# Patient Record
Sex: Female | Born: 1935 | Race: White | Hispanic: No | Marital: Married | Smoking: Former smoker
Health system: Southern US, Community
[De-identification: ages and names within clinical notes are randomized; demographics above are authoritative.]

## PROBLEM LIST (undated history)

## (undated) DIAGNOSIS — S82202A Unspecified fracture of shaft of left tibia, initial encounter for closed fracture: Secondary | ICD-10-CM

## (undated) DIAGNOSIS — E119 Type 2 diabetes mellitus without complications: Secondary | ICD-10-CM

## (undated) DIAGNOSIS — S62101A Fracture of unspecified carpal bone, right wrist, initial encounter for closed fracture: Secondary | ICD-10-CM

## (undated) DIAGNOSIS — I2510 Atherosclerotic heart disease of native coronary artery without angina pectoris: Secondary | ICD-10-CM

## (undated) DIAGNOSIS — J939 Pneumothorax, unspecified: Secondary | ICD-10-CM

## (undated) DIAGNOSIS — F32A Depression, unspecified: Secondary | ICD-10-CM

## (undated) DIAGNOSIS — I739 Peripheral vascular disease, unspecified: Secondary | ICD-10-CM

## (undated) DIAGNOSIS — F0390 Unspecified dementia without behavioral disturbance: Secondary | ICD-10-CM

## (undated) DIAGNOSIS — F329 Major depressive disorder, single episode, unspecified: Secondary | ICD-10-CM

## (undated) DIAGNOSIS — S42411A Displaced simple supracondylar fracture without intercondylar fracture of right humerus, initial encounter for closed fracture: Secondary | ICD-10-CM

## (undated) DIAGNOSIS — J449 Chronic obstructive pulmonary disease, unspecified: Secondary | ICD-10-CM

## (undated) DIAGNOSIS — I10 Essential (primary) hypertension: Secondary | ICD-10-CM

## (undated) DIAGNOSIS — N39498 Other specified urinary incontinence: Secondary | ICD-10-CM

## (undated) DIAGNOSIS — S32019A Unspecified fracture of first lumbar vertebra, initial encounter for closed fracture: Secondary | ICD-10-CM

## (undated) DIAGNOSIS — E785 Hyperlipidemia, unspecified: Secondary | ICD-10-CM

## (undated) HISTORY — PX: KIDNEY SURGERY: SHX687

## (undated) HISTORY — PX: CHOLECYSTECTOMY: SHX55

## (undated) HISTORY — PX: CORONARY ARTERY BYPASS GRAFT: SHX141

## (undated) HISTORY — PX: PEG TUBE PLACEMENT: SUR1034

## (undated) HISTORY — PX: OTHER SURGICAL HISTORY: SHX169

---

## 2016-06-21 ENCOUNTER — Other Ambulatory Visit (HOSPITAL_COMMUNITY): Payer: Self-pay

## 2016-06-21 ENCOUNTER — Inpatient Hospital Stay
Admission: AD | Admit: 2016-06-21 | Discharge: 2016-07-21 | Disposition: A | Discharge status: Skilled Nursing Facility | Payer: Medicare Other | Source: Ambulatory Visit | Attending: Internal Medicine | Admitting: Internal Medicine

## 2016-06-21 DIAGNOSIS — Z4659 Encounter for fitting and adjustment of other gastrointestinal appliance and device: Secondary | ICD-10-CM

## 2016-06-21 DIAGNOSIS — T148XXA Other injury of unspecified body region, initial encounter: Secondary | ICD-10-CM

## 2016-06-21 DIAGNOSIS — S42321A Displaced transverse fracture of shaft of humerus, right arm, initial encounter for closed fracture: Secondary | ICD-10-CM

## 2016-06-21 DIAGNOSIS — Z931 Gastrostomy status: Secondary | ICD-10-CM

## 2016-06-21 DIAGNOSIS — Z0189 Encounter for other specified special examinations: Secondary | ICD-10-CM

## 2016-06-21 DIAGNOSIS — T85598A Other mechanical complication of other gastrointestinal prosthetic devices, implants and grafts, initial encounter: Secondary | ICD-10-CM

## 2016-06-21 DIAGNOSIS — J9690 Respiratory failure, unspecified, unspecified whether with hypoxia or hypercapnia: Secondary | ICD-10-CM

## 2016-06-21 DIAGNOSIS — R1310 Dysphagia, unspecified: Secondary | ICD-10-CM

## 2016-06-21 DIAGNOSIS — J90 Pleural effusion, not elsewhere classified: Secondary | ICD-10-CM

## 2016-06-21 LAB — BLOOD GAS, ARTERIAL
Acid-Base Excess: 4.8 mmol/L — ABNORMAL HIGH (ref 0.0–2.0)
Bicarbonate: 27.9 mmol/L (ref 20.0–28.0)
FIO2: 35
O2 Saturation: 97.8 %
PEEP: 5 cmH2O
Patient temperature: 98.6
RATE: 12 resp/min
VT: 420 mL
pCO2 arterial: 34.8 mmHg (ref 32.0–48.0)
pH, Arterial: 7.515 — ABNORMAL HIGH (ref 7.350–7.450)
pO2, Arterial: 93.3 mmHg (ref 83.0–108.0)

## 2016-06-21 LAB — URINALYSIS, ROUTINE W REFLEX MICROSCOPIC
Bilirubin Urine: NEGATIVE
Glucose, UA: NEGATIVE mg/dL
Hgb urine dipstick: NEGATIVE
KETONES UR: NEGATIVE mg/dL
NITRITE: NEGATIVE
PH: 7.5 (ref 5.0–8.0)
Protein, ur: 30 mg/dL — AB
SPECIFIC GRAVITY, URINE: 1.024 (ref 1.005–1.030)

## 2016-06-21 LAB — URINE MICROSCOPIC-ADD ON: RBC / HPF: NONE SEEN RBC/hpf (ref 0–5)

## 2016-06-22 LAB — BLOOD GAS, ARTERIAL
Acid-Base Excess: 3 mmol/L — ABNORMAL HIGH (ref 0.0–2.0)
Bicarbonate: 26.4 mmol/L (ref 20.0–28.0)
DRAWN BY: 290171
FIO2: 28
LHR: 12 {breaths}/min
MECHVT: 400 mL
O2 SAT: 97.8 %
PATIENT TEMPERATURE: 98.6
PCO2 ART: 36 mmHg (ref 32.0–48.0)
PEEP: 5 cmH2O
PO2 ART: 93.5 mmHg (ref 83.0–108.0)
pH, Arterial: 7.478 — ABNORMAL HIGH (ref 7.350–7.450)

## 2016-06-22 LAB — C DIFFICILE QUICK SCREEN W PCR REFLEX
C DIFFICILE (CDIFF) TOXIN: NEGATIVE
C DIFFICLE (CDIFF) ANTIGEN: NEGATIVE
C Diff interpretation: NOT DETECTED

## 2016-06-22 LAB — COMPREHENSIVE METABOLIC PANEL
ALBUMIN: 2.2 g/dL — AB (ref 3.5–5.0)
ALT: 23 U/L (ref 14–54)
AST: 26 U/L (ref 15–41)
Alkaline Phosphatase: 109 U/L (ref 38–126)
Anion gap: 8 (ref 5–15)
BUN: 38 mg/dL — AB (ref 6–20)
CHLORIDE: 106 mmol/L (ref 101–111)
CO2: 27 mmol/L (ref 22–32)
Calcium: 8.6 mg/dL — ABNORMAL LOW (ref 8.9–10.3)
Creatinine, Ser: 0.83 mg/dL (ref 0.44–1.00)
GFR calc Af Amer: >60 mL/min (ref >60)
GFR calc non Af Amer: >60 mL/min (ref >60)
GLUCOSE: 252 mg/dL — AB (ref 65–99)
POTASSIUM: 4.8 mmol/L (ref 3.5–5.1)
Sodium: 141 mmol/L (ref 135–145)
Total Bilirubin: 1 mg/dL (ref 0.3–1.2)
Total Protein: 5.5 g/dL — ABNORMAL LOW (ref 6.5–8.1)

## 2016-06-22 LAB — PHOSPHORUS: PHOSPHORUS: 3.1 mg/dL (ref 2.5–4.6)

## 2016-06-22 LAB — CBC WITH DIFFERENTIAL/PLATELET
Basophils Absolute: 0 10*3/uL (ref 0.0–0.1)
Basophils Relative: 0 %
EOS ABS: 0.3 10*3/uL (ref 0.0–0.7)
EOS PCT: 2 %
HCT: 28.4 % — ABNORMAL LOW (ref 36.0–46.0)
Hemoglobin: 8.7 g/dL — ABNORMAL LOW (ref 12.0–15.0)
LYMPHS ABS: 1.2 10*3/uL (ref 0.7–4.0)
Lymphocytes Relative: 8 %
MCH: 30.2 pg (ref 26.0–34.0)
MCHC: 30.6 g/dL (ref 30.0–36.0)
MCV: 98.6 fL (ref 78.0–100.0)
MONOS PCT: 5 %
Monocytes Absolute: 0.8 10*3/uL (ref 0.1–1.0)
Neutro Abs: 13.6 10*3/uL — ABNORMAL HIGH (ref 1.7–7.7)
Neutrophils Relative %: 85 %
PLATELETS: 544 10*3/uL — AB (ref 150–400)
RBC: 2.88 MIL/uL — ABNORMAL LOW (ref 3.87–5.11)
RDW: 19.6 % — ABNORMAL HIGH (ref 11.5–15.5)
WBC: 15.9 10*3/uL — AB (ref 4.0–10.5)

## 2016-06-22 LAB — T4, FREE: Free T4: 1.34 ng/dL — ABNORMAL HIGH (ref 0.61–1.12)

## 2016-06-22 LAB — PROCALCITONIN: Procalcitonin: 0.16 ng/mL

## 2016-06-22 LAB — TSH: TSH: 1.671 u[IU]/mL (ref 0.350–4.500)

## 2016-06-22 LAB — PROTIME-INR
INR: 1.1
PROTHROMBIN TIME: 14.2 s (ref 11.4–15.2)

## 2016-06-22 LAB — MAGNESIUM: MAGNESIUM: 2.2 mg/dL (ref 1.7–2.4)

## 2016-06-23 ENCOUNTER — Other Ambulatory Visit (HOSPITAL_COMMUNITY): Payer: Self-pay

## 2016-06-23 LAB — HEMOGLOBIN A1C
Hgb A1c MFr Bld: 6.2 % — ABNORMAL HIGH (ref 4.8–5.6)
MEAN PLASMA GLUCOSE: 131 mg/dL

## 2016-06-23 LAB — URINE CULTURE

## 2016-06-25 LAB — CULTURE, RESPIRATORY

## 2016-06-25 LAB — CULTURE, RESPIRATORY W GRAM STAIN

## 2016-06-26 ENCOUNTER — Other Ambulatory Visit (HOSPITAL_COMMUNITY): Payer: Self-pay

## 2016-06-26 LAB — CBC WITH DIFFERENTIAL/PLATELET
Basophils Absolute: 0 10*3/uL (ref 0.0–0.1)
Basophils Relative: 0 %
EOS ABS: 0.4 10*3/uL (ref 0.0–0.7)
Eosinophils Relative: 5 %
HEMATOCRIT: 28.9 % — AB (ref 36.0–46.0)
HEMOGLOBIN: 8.6 g/dL — AB (ref 12.0–15.0)
LYMPHS ABS: 1.1 10*3/uL (ref 0.7–4.0)
LYMPHS PCT: 15 %
MCH: 29.8 pg (ref 26.0–34.0)
MCHC: 29.8 g/dL — ABNORMAL LOW (ref 30.0–36.0)
MCV: 100 fL (ref 78.0–100.0)
Monocytes Absolute: 0.5 10*3/uL (ref 0.1–1.0)
Monocytes Relative: 6 %
NEUTROS ABS: 5.7 10*3/uL (ref 1.7–7.7)
NEUTROS PCT: 74 %
Platelets: 510 10*3/uL — ABNORMAL HIGH (ref 150–400)
RBC: 2.89 MIL/uL — AB (ref 3.87–5.11)
RDW: 18.7 % — ABNORMAL HIGH (ref 11.5–15.5)
WBC: 7.8 10*3/uL (ref 4.0–10.5)

## 2016-06-26 LAB — BASIC METABOLIC PANEL
ANION GAP: 8 (ref 5–15)
BUN: 35 mg/dL — ABNORMAL HIGH (ref 6–20)
CALCIUM: 8.6 mg/dL — AB (ref 8.9–10.3)
CO2: 26 mmol/L (ref 22–32)
Chloride: 107 mmol/L (ref 101–111)
Creatinine, Ser: 0.78 mg/dL (ref 0.44–1.00)
GFR calc non Af Amer: >60 mL/min (ref >60)
Glucose, Bld: 119 mg/dL — ABNORMAL HIGH (ref 65–99)
Potassium: 4.6 mmol/L (ref 3.5–5.1)
Sodium: 141 mmol/L (ref 135–145)

## 2016-06-26 LAB — MAGNESIUM: MAGNESIUM: 1.9 mg/dL (ref 1.7–2.4)

## 2016-06-26 LAB — PHOSPHORUS: Phosphorus: 2.9 mg/dL (ref 2.5–4.6)

## 2016-06-27 ENCOUNTER — Other Ambulatory Visit (HOSPITAL_COMMUNITY): Payer: Self-pay

## 2016-06-27 DIAGNOSIS — S42321A Displaced transverse fracture of shaft of humerus, right arm, initial encounter for closed fracture: Secondary | ICD-10-CM
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
## 2016-06-28 ENCOUNTER — Other Ambulatory Visit (HOSPITAL_COMMUNITY): Payer: Self-pay

## 2016-06-28 NOTE — Progress Notes (Signed)
Unable to perform thoracentesis of left pleural effusion as there is no access as the patient is in a TLSO brace which can't be removed.  She has a broken right arm and she would need to be right side down.  Due to multiple issues such as these, we did not proceed with procedure.  [facility] and informed them of this issue.  Kelven Flater E 2:46 PM 06/28/2016

## 2016-07-01 LAB — BASIC METABOLIC PANEL
Anion gap: 10 (ref 5–15)
BUN: 23 mg/dL — ABNORMAL HIGH (ref 6–20)
CO2: 25 mmol/L (ref 22–32)
Calcium: 8.5 mg/dL — ABNORMAL LOW (ref 8.9–10.3)
Chloride: 99 mmol/L — ABNORMAL LOW (ref 101–111)
Creatinine, Ser: 0.73 mg/dL (ref 0.44–1.00)
GFR calc non Af Amer: >60 mL/min (ref >60)
GLUCOSE: 112 mg/dL — AB (ref 65–99)
POTASSIUM: 4.6 mmol/L (ref 3.5–5.1)
SODIUM: 134 mmol/L — AB (ref 135–145)

## 2016-07-01 LAB — MAGNESIUM: Magnesium: 1.9 mg/dL (ref 1.7–2.4)

## 2016-07-02 ENCOUNTER — Other Ambulatory Visit (HOSPITAL_COMMUNITY): Payer: Self-pay

## 2016-07-03 LAB — BASIC METABOLIC PANEL
ANION GAP: 10 (ref 5–15)
BUN: 23 mg/dL — AB (ref 6–20)
CHLORIDE: 101 mmol/L (ref 101–111)
CO2: 24 mmol/L (ref 22–32)
Calcium: 8.6 mg/dL — ABNORMAL LOW (ref 8.9–10.3)
Creatinine, Ser: 0.77 mg/dL (ref 0.44–1.00)
Glucose, Bld: 102 mg/dL — ABNORMAL HIGH (ref 65–99)
POTASSIUM: 4.5 mmol/L (ref 3.5–5.1)
SODIUM: 135 mmol/L (ref 135–145)

## 2016-07-03 LAB — CBC WITH DIFFERENTIAL/PLATELET
BASOS ABS: 0 10*3/uL (ref 0.0–0.1)
Basophils Relative: 0 %
EOS PCT: 5 %
Eosinophils Absolute: 0.5 10*3/uL (ref 0.0–0.7)
HCT: 31.6 % — ABNORMAL LOW (ref 36.0–46.0)
HEMOGLOBIN: 9.3 g/dL — AB (ref 12.0–15.0)
LYMPHS ABS: 1.3 10*3/uL (ref 0.7–4.0)
LYMPHS PCT: 13 %
MCH: 28.8 pg (ref 26.0–34.0)
MCHC: 29.4 g/dL — ABNORMAL LOW (ref 30.0–36.0)
MCV: 97.8 fL (ref 78.0–100.0)
Monocytes Absolute: 0.7 10*3/uL (ref 0.1–1.0)
Monocytes Relative: 7 %
NEUTROS PCT: 75 %
Neutro Abs: 7.2 10*3/uL (ref 1.7–7.7)
PLATELETS: 403 10*3/uL — AB (ref 150–400)
RBC: 3.23 MIL/uL — AB (ref 3.87–5.11)
RDW: 19 % — ABNORMAL HIGH (ref 11.5–15.5)
WBC: 9.7 10*3/uL (ref 4.0–10.5)

## 2016-07-05 ENCOUNTER — Other Ambulatory Visit (HOSPITAL_COMMUNITY): Payer: Self-pay
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
## 2016-07-07 LAB — CBC WITH DIFFERENTIAL/PLATELET
Basophils Absolute: 0.1 10*3/uL (ref 0.0–0.1)
Basophils Relative: 1 %
EOS ABS: 0.3 10*3/uL (ref 0.0–0.7)
Eosinophils Relative: 3 %
HEMATOCRIT: 34.9 % — AB (ref 36.0–46.0)
HEMOGLOBIN: 10.7 g/dL — AB (ref 12.0–15.0)
LYMPHS ABS: 1.5 10*3/uL (ref 0.7–4.0)
LYMPHS PCT: 14 %
MCH: 30.3 pg (ref 26.0–34.0)
MCHC: 30.7 g/dL (ref 30.0–36.0)
MCV: 98.9 fL (ref 78.0–100.0)
MONOS PCT: 8 %
Monocytes Absolute: 0.8 10*3/uL (ref 0.1–1.0)
NEUTROS ABS: 7.9 10*3/uL — AB (ref 1.7–7.7)
NEUTROS PCT: 74 %
Platelets: 396 10*3/uL (ref 150–400)
RBC: 3.53 MIL/uL — AB (ref 3.87–5.11)
RDW: 18.5 % — ABNORMAL HIGH (ref 11.5–15.5)
WBC: 10.5 10*3/uL (ref 4.0–10.5)

## 2016-07-07 LAB — MAGNESIUM: Magnesium: 1.8 mg/dL (ref 1.7–2.4)

## 2016-07-07 LAB — BASIC METABOLIC PANEL WITH GFR
Anion gap: 10 (ref 5–15)
BUN: 27 mg/dL — ABNORMAL HIGH (ref 6–20)
CO2: 29 mmol/L (ref 22–32)
Calcium: 9 mg/dL (ref 8.9–10.3)
Chloride: 95 mmol/L — ABNORMAL LOW (ref 101–111)
Creatinine, Ser: 0.63 mg/dL (ref 0.44–1.00)
GFR calc Af Amer: >60 mL/min
GFR calc non Af Amer: >60 mL/min
Glucose, Bld: 138 mg/dL — ABNORMAL HIGH (ref 65–99)
Potassium: 4.7 mmol/L (ref 3.5–5.1)
Sodium: 134 mmol/L — ABNORMAL LOW (ref 135–145)

## 2016-07-07 LAB — PHOSPHORUS: Phosphorus: 2.9 mg/dL (ref 2.5–4.6)

## 2016-07-08 LAB — URINALYSIS, ROUTINE W REFLEX MICROSCOPIC
Bilirubin Urine: NEGATIVE
GLUCOSE, UA: NEGATIVE mg/dL
HGB URINE DIPSTICK: NEGATIVE
KETONES UR: NEGATIVE mg/dL
Leukocytes, UA: NEGATIVE
Nitrite: NEGATIVE
PROTEIN: NEGATIVE mg/dL
Specific Gravity, Urine: 1.023 (ref 1.005–1.030)
pH: 7.5 (ref 5.0–8.0)

## 2016-07-10 ENCOUNTER — Other Ambulatory Visit (HOSPITAL_COMMUNITY): Payer: Self-pay

## 2016-07-10 HISTORY — PX: IR GENERIC HISTORICAL: IMG1180011

## 2016-07-10 LAB — URINE CULTURE

## 2016-07-10 MED ORDER — IOPAMIDOL (ISOVUE-300) INJECTION 61%
INTRAVENOUS | Status: AC
  Administered 2016-07-10: 10 mL
  Filled 2016-07-10: qty 50

## 2016-07-10 MED ORDER — LIDOCAINE HCL 1 % IJ SOLN
INTRAMUSCULAR | Status: AC
  Filled 2016-07-10: qty 20

## 2016-07-10 MED ORDER — GLUCAGON HCL RDNA (DIAGNOSTIC) 1 MG IJ SOLR
INTRAMUSCULAR | Status: AC
  Filled 2016-07-10: qty 1

## 2016-07-10 MED ORDER — FENTANYL CITRATE (PF) 100 MCG/2ML IJ SOLN
INTRAMUSCULAR | Status: AC
  Filled 2016-07-10: qty 2

## 2016-07-10 MED ORDER — MIDAZOLAM HCL 2 MG/2ML IJ SOLN
INTRAMUSCULAR | Status: AC
  Filled 2016-07-10: qty 2

## 2016-07-10 MED ORDER — FENTANYL CITRATE (PF) 100 MCG/2ML IJ SOLN
INTRAMUSCULAR | Status: AC | PRN
  Administered 2016-07-10: 50 ug via INTRAVENOUS

## 2016-07-10 MED ORDER — MIDAZOLAM HCL 2 MG/2ML IJ SOLN
INTRAMUSCULAR | Status: AC | PRN
  Administered 2016-07-10: 1 mg via INTRAVENOUS

## 2016-07-10 MED ORDER — CEFAZOLIN SODIUM-DEXTROSE 2-4 GM/100ML-% IV SOLN
INTRAVENOUS | Status: AC
  Administered 2016-07-10: 2000 mg
  Filled 2016-07-10: qty 100

## 2016-07-10 MED ORDER — LIDOCAINE HCL 1 % IJ SOLN
INTRAMUSCULAR | Status: DC | PRN
  Administered 2016-07-10: 5 mL

## 2016-07-10 NOTE — Sedation Documentation (Signed)
Patient is resting comfortably. 

## 2016-07-10 NOTE — Procedures (Signed)
Interventional Radiology Procedure Note  Procedure:  Percutaneous gastrostomy  Complications: None  Estimated Blood Loss: < 10 mL  20 Fr bumper retention gastrostomy tube placed with tip in body of stomach.  OK to use in 24 hours.  Jodi MarbleGlenn T. Fredia SorrowYamagata, M.D Pager:  [phone]

## 2016-07-10 NOTE — Sedation Documentation (Signed)
Patient denies pain and is resting comfortably.  

## 2016-07-11 ENCOUNTER — Encounter (HOSPITAL_COMMUNITY): Payer: Self-pay | Admitting: Interventional Radiology

## 2016-07-11 LAB — CBC WITH DIFFERENTIAL/PLATELET
BASOS ABS: 0 10*3/uL (ref 0.0–0.1)
BASOS PCT: 0 %
EOS ABS: 0.3 10*3/uL (ref 0.0–0.7)
EOS PCT: 3 %
HCT: 33.6 % — ABNORMAL LOW (ref 36.0–46.0)
Hemoglobin: 10.5 g/dL — ABNORMAL LOW (ref 12.0–15.0)
Lymphocytes Relative: 11 %
Lymphs Abs: 1.1 10*3/uL (ref 0.7–4.0)
MCH: 31 pg (ref 26.0–34.0)
MCHC: 31.3 g/dL (ref 30.0–36.0)
MCV: 99.1 fL (ref 78.0–100.0)
MONO ABS: 0.8 10*3/uL (ref 0.1–1.0)
MONOS PCT: 8 %
NEUTROS ABS: 7.7 10*3/uL (ref 1.7–7.7)
Neutrophils Relative %: 78 %
PLATELETS: 373 10*3/uL (ref 150–400)
RBC: 3.39 MIL/uL — ABNORMAL LOW (ref 3.87–5.11)
RDW: 17.8 % — AB (ref 11.5–15.5)
WBC: 9.8 10*3/uL (ref 4.0–10.5)

## 2016-07-11 LAB — BASIC METABOLIC PANEL
ANION GAP: 12 (ref 5–15)
BUN: 21 mg/dL — ABNORMAL HIGH (ref 6–20)
CALCIUM: 8.6 mg/dL — AB (ref 8.9–10.3)
CO2: 27 mmol/L (ref 22–32)
CREATININE: 0.58 mg/dL (ref 0.44–1.00)
Chloride: 94 mmol/L — ABNORMAL LOW (ref 101–111)
Glucose, Bld: 105 mg/dL — ABNORMAL HIGH (ref 65–99)
Potassium: 4.4 mmol/L (ref 3.5–5.1)
SODIUM: 133 mmol/L — AB (ref 135–145)

## 2016-07-11 NOTE — Progress Notes (Signed)
Patient ID: Diana Hill, female   DOB: 1931/04/17, 82 y.o.   MRN: [id number]   Percutaneous gastric tube placed 11/20 in IR  afeb Site is clean and dry; NT; no bleeding +BS  Ready to use now

## 2016-07-17 LAB — COMPREHENSIVE METABOLIC PANEL
ALBUMIN: 2.7 g/dL — AB (ref 3.5–5.0)
ALT: 21 U/L (ref 14–54)
ANION GAP: 11 (ref 5–15)
AST: 28 U/L (ref 15–41)
Alkaline Phosphatase: 127 U/L — ABNORMAL HIGH (ref 38–126)
BUN: 33 mg/dL — AB (ref 6–20)
CHLORIDE: 98 mmol/L — AB (ref 101–111)
CO2: 26 mmol/L (ref 22–32)
Calcium: 9.2 mg/dL (ref 8.9–10.3)
Creatinine, Ser: 0.63 mg/dL (ref 0.44–1.00)
GFR calc Af Amer: >60 mL/min (ref >60)
GFR calc non Af Amer: >60 mL/min (ref >60)
GLUCOSE: 167 mg/dL — AB (ref 65–99)
POTASSIUM: 4.3 mmol/L (ref 3.5–5.1)
SODIUM: 135 mmol/L (ref 135–145)
Total Bilirubin: 0.4 mg/dL (ref 0.3–1.2)
Total Protein: 5.8 g/dL — ABNORMAL LOW (ref 6.5–8.1)

## 2016-07-17 LAB — CBC
HEMATOCRIT: 37.3 % (ref 36.0–46.0)
Hemoglobin: 11.8 g/dL — ABNORMAL LOW (ref 12.0–15.0)
MCH: 31.3 pg (ref 26.0–34.0)
MCHC: 31.6 g/dL (ref 30.0–36.0)
MCV: 98.9 fL (ref 78.0–100.0)
PLATELETS: 427 10*3/uL — AB (ref 150–400)
RBC: 3.77 MIL/uL — ABNORMAL LOW (ref 3.87–5.11)
RDW: 17.3 % — AB (ref 11.5–15.5)
WBC: 10 10*3/uL (ref 4.0–10.5)

## 2016-09-14 ENCOUNTER — Encounter (HOSPITAL_COMMUNITY): Payer: Self-pay | Admitting: Emergency Medicine

## 2016-09-14 ENCOUNTER — Inpatient Hospital Stay (HOSPITAL_COMMUNITY)
Admission: EM | Admit: 2016-09-14 | Discharge: 2016-09-19 | DRG: 378 | Disposition: A | Discharge status: Skilled Nursing Facility | Payer: Medicare Other | Attending: Internal Medicine | Admitting: Internal Medicine

## 2016-09-14 ENCOUNTER — Emergency Department (HOSPITAL_COMMUNITY): Payer: Medicare Other

## 2016-09-14 DIAGNOSIS — E119 Type 2 diabetes mellitus without complications: Secondary | ICD-10-CM

## 2016-09-14 DIAGNOSIS — R1310 Dysphagia, unspecified: Secondary | ICD-10-CM | POA: Diagnosis present

## 2016-09-14 DIAGNOSIS — S42411D Displaced simple supracondylar fracture without intercondylar fracture of right humerus, subsequent encounter for fracture with routine healing: Secondary | ICD-10-CM

## 2016-09-14 DIAGNOSIS — Z79899 Other long term (current) drug therapy: Secondary | ICD-10-CM | POA: Diagnosis not present

## 2016-09-14 DIAGNOSIS — J449 Chronic obstructive pulmonary disease, unspecified: Secondary | ICD-10-CM | POA: Diagnosis present

## 2016-09-14 DIAGNOSIS — F329 Major depressive disorder, single episode, unspecified: Secondary | ICD-10-CM | POA: Diagnosis present

## 2016-09-14 DIAGNOSIS — Z7901 Long term (current) use of anticoagulants: Secondary | ICD-10-CM | POA: Diagnosis not present

## 2016-09-14 DIAGNOSIS — I10 Essential (primary) hypertension: Secondary | ICD-10-CM | POA: Diagnosis present

## 2016-09-14 DIAGNOSIS — E1151 Type 2 diabetes mellitus with diabetic peripheral angiopathy without gangrene: Secondary | ICD-10-CM | POA: Diagnosis present

## 2016-09-14 DIAGNOSIS — Z794 Long term (current) use of insulin: Secondary | ICD-10-CM

## 2016-09-14 DIAGNOSIS — R32 Unspecified urinary incontinence: Secondary | ICD-10-CM | POA: Diagnosis present

## 2016-09-14 DIAGNOSIS — F0390 Unspecified dementia without behavioral disturbance: Secondary | ICD-10-CM | POA: Diagnosis present

## 2016-09-14 DIAGNOSIS — K922 Gastrointestinal hemorrhage, unspecified: Secondary | ICD-10-CM

## 2016-09-14 DIAGNOSIS — Z888 Allergy status to other drugs, medicaments and biological substances status: Secondary | ICD-10-CM

## 2016-09-14 DIAGNOSIS — S2243XD Multiple fractures of ribs, bilateral, subsequent encounter for fracture with routine healing: Secondary | ICD-10-CM

## 2016-09-14 DIAGNOSIS — H5462 Unqualified visual loss, left eye, normal vision right eye: Secondary | ICD-10-CM | POA: Diagnosis present

## 2016-09-14 DIAGNOSIS — Z20828 Contact with and (suspected) exposure to other viral communicable diseases: Secondary | ICD-10-CM | POA: Diagnosis present

## 2016-09-14 DIAGNOSIS — K219 Gastro-esophageal reflux disease without esophagitis: Secondary | ICD-10-CM | POA: Diagnosis present

## 2016-09-14 DIAGNOSIS — E785 Hyperlipidemia, unspecified: Secondary | ICD-10-CM | POA: Diagnosis present

## 2016-09-14 DIAGNOSIS — K9420 Gastrostomy complication, unspecified: Secondary | ICD-10-CM | POA: Diagnosis not present

## 2016-09-14 DIAGNOSIS — Z87891 Personal history of nicotine dependence: Secondary | ICD-10-CM | POA: Diagnosis not present

## 2016-09-14 DIAGNOSIS — B952 Enterococcus as the cause of diseases classified elsewhere: Secondary | ICD-10-CM | POA: Diagnosis present

## 2016-09-14 DIAGNOSIS — Z993 Dependence on wheelchair: Secondary | ICD-10-CM

## 2016-09-14 DIAGNOSIS — Z66 Do not resuscitate: Secondary | ICD-10-CM | POA: Diagnosis present

## 2016-09-14 DIAGNOSIS — Z931 Gastrostomy status: Secondary | ICD-10-CM | POA: Diagnosis not present

## 2016-09-14 DIAGNOSIS — Z951 Presence of aortocoronary bypass graft: Secondary | ICD-10-CM | POA: Diagnosis not present

## 2016-09-14 DIAGNOSIS — K9429 Other complications of gastrostomy: Secondary | ICD-10-CM | POA: Insufficient documentation

## 2016-09-14 DIAGNOSIS — R633 Feeding difficulties, unspecified: Secondary | ICD-10-CM

## 2016-09-14 DIAGNOSIS — S42411A Displaced simple supracondylar fracture without intercondylar fracture of right humerus, initial encounter for closed fracture: Secondary | ICD-10-CM | POA: Diagnosis present

## 2016-09-14 DIAGNOSIS — Z7401 Bed confinement status: Secondary | ICD-10-CM | POA: Diagnosis not present

## 2016-09-14 DIAGNOSIS — S32039D Unspecified fracture of third lumbar vertebra, subsequent encounter for fracture with routine healing: Secondary | ICD-10-CM

## 2016-09-14 DIAGNOSIS — I252 Old myocardial infarction: Secondary | ICD-10-CM

## 2016-09-14 DIAGNOSIS — S82202D Unspecified fracture of shaft of left tibia, subsequent encounter for closed fracture with routine healing: Secondary | ICD-10-CM

## 2016-09-14 DIAGNOSIS — E669 Obesity, unspecified: Secondary | ICD-10-CM | POA: Diagnosis present

## 2016-09-14 DIAGNOSIS — K920 Hematemesis: Principal | ICD-10-CM

## 2016-09-14 DIAGNOSIS — I2510 Atherosclerotic heart disease of native coronary artery without angina pectoris: Secondary | ICD-10-CM | POA: Diagnosis present

## 2016-09-14 DIAGNOSIS — N390 Urinary tract infection, site not specified: Secondary | ICD-10-CM | POA: Diagnosis present

## 2016-09-14 DIAGNOSIS — R627 Adult failure to thrive: Secondary | ICD-10-CM | POA: Diagnosis present

## 2016-09-14 DIAGNOSIS — Z86718 Personal history of other venous thrombosis and embolism: Secondary | ICD-10-CM

## 2016-09-14 DIAGNOSIS — I82409 Acute embolism and thrombosis of unspecified deep veins of unspecified lower extremity: Secondary | ICD-10-CM | POA: Diagnosis present

## 2016-09-14 DIAGNOSIS — S32019A Unspecified fracture of first lumbar vertebra, initial encounter for closed fracture: Secondary | ICD-10-CM | POA: Diagnosis present

## 2016-09-14 DIAGNOSIS — Z7902 Long term (current) use of antithrombotics/antiplatelets: Secondary | ICD-10-CM

## 2016-09-14 DIAGNOSIS — S82202A Unspecified fracture of shaft of left tibia, initial encounter for closed fracture: Secondary | ICD-10-CM | POA: Diagnosis present

## 2016-09-14 DIAGNOSIS — I82402 Acute embolism and thrombosis of unspecified deep veins of left lower extremity: Secondary | ICD-10-CM | POA: Diagnosis not present

## 2016-09-14 DIAGNOSIS — Z6825 Body mass index (BMI) 25.0-25.9, adult: Secondary | ICD-10-CM

## 2016-09-14 DIAGNOSIS — S32019D Unspecified fracture of first lumbar vertebra, subsequent encounter for fracture with routine healing: Secondary | ICD-10-CM

## 2016-09-14 HISTORY — DX: Depression, unspecified: F32.A

## 2016-09-14 HISTORY — DX: Fracture of unspecified carpal bone, right wrist, initial encounter for closed fracture: S62.101A

## 2016-09-14 HISTORY — DX: Unspecified fracture of first lumbar vertebra, initial encounter for closed fracture: S32.019A

## 2016-09-14 HISTORY — DX: Displaced simple supracondylar fracture without intercondylar fracture of right humerus, initial encounter for closed fracture: S42.411A

## 2016-09-14 HISTORY — DX: Chronic obstructive pulmonary disease, unspecified: J44.9

## 2016-09-14 HISTORY — DX: Unspecified dementia, unspecified severity, without behavioral disturbance, psychotic disturbance, mood disturbance, and anxiety: F03.90

## 2016-09-14 HISTORY — DX: Major depressive disorder, single episode, unspecified: F32.9

## 2016-09-14 HISTORY — DX: Peripheral vascular disease, unspecified: I73.9

## 2016-09-14 HISTORY — DX: Essential (primary) hypertension: I10

## 2016-09-14 HISTORY — DX: Type 2 diabetes mellitus without complications: E11.9

## 2016-09-14 HISTORY — DX: Atherosclerotic heart disease of native coronary artery without angina pectoris: I25.10

## 2016-09-14 HISTORY — DX: Hyperlipidemia, unspecified: E78.5

## 2016-09-14 HISTORY — DX: Other specified urinary incontinence: N39.498

## 2016-09-14 HISTORY — DX: Unspecified fracture of shaft of left tibia, initial encounter for closed fracture: S82.202A

## 2016-09-14 HISTORY — DX: Pneumothorax, unspecified: J93.9

## 2016-09-14 LAB — CBC
HCT: 34.6 % — ABNORMAL LOW (ref 36.0–46.0)
Hemoglobin: 11.1 g/dL — ABNORMAL LOW (ref 12.0–15.0)
MCH: 29.9 pg (ref 26.0–34.0)
MCHC: 32.1 g/dL (ref 30.0–36.0)
MCV: 93.3 fL (ref 78.0–100.0)
PLATELETS: 271 10*3/uL (ref 150–400)
RBC: 3.71 MIL/uL — ABNORMAL LOW (ref 3.87–5.11)
RDW: 15 % (ref 11.5–15.5)
WBC: 8.9 10*3/uL (ref 4.0–10.5)

## 2016-09-14 LAB — COMPREHENSIVE METABOLIC PANEL
ALK PHOS: 78 U/L (ref 38–126)
ALT: 15 U/L (ref 14–54)
AST: 21 U/L (ref 15–41)
Albumin: 3.5 g/dL (ref 3.5–5.0)
Anion gap: 14 (ref 5–15)
BILIRUBIN TOTAL: 0.1 mg/dL — AB (ref 0.3–1.2)
BUN: 30 mg/dL — ABNORMAL HIGH (ref 6–20)
CALCIUM: 9.4 mg/dL (ref 8.9–10.3)
CO2: 28 mmol/L (ref 22–32)
Chloride: 94 mmol/L — ABNORMAL LOW (ref 101–111)
Creatinine, Ser: 0.66 mg/dL (ref 0.44–1.00)
GFR calc Af Amer: >60 mL/min (ref >60)
GLUCOSE: 324 mg/dL — AB (ref 65–99)
POTASSIUM: 4.3 mmol/L (ref 3.5–5.1)
Sodium: 136 mmol/L (ref 135–145)
TOTAL PROTEIN: 7.1 g/dL (ref 6.5–8.1)

## 2016-09-14 LAB — CBC WITH DIFFERENTIAL/PLATELET
BASOS ABS: 0 10*3/uL (ref 0.0–0.1)
BASOS PCT: 0 %
EOS ABS: 0 10*3/uL (ref 0.0–0.7)
Eosinophils Relative: 0 %
HCT: 39.4 % (ref 36.0–46.0)
Hemoglobin: 13 g/dL (ref 12.0–15.0)
Lymphocytes Relative: 8 %
Lymphs Abs: 1 10*3/uL (ref 0.7–4.0)
MCH: 31.2 pg (ref 26.0–34.0)
MCHC: 33 g/dL (ref 30.0–36.0)
MCV: 94.5 fL (ref 78.0–100.0)
MONOS PCT: 6 %
Monocytes Absolute: 0.8 10*3/uL (ref 0.1–1.0)
NEUTROS ABS: 11.2 10*3/uL — AB (ref 1.7–7.7)
NEUTROS PCT: 86 %
Platelets: 308 10*3/uL (ref 150–400)
RBC: 4.17 MIL/uL (ref 3.87–5.11)
RDW: 14.8 % (ref 11.5–15.5)
WBC: 13 10*3/uL — ABNORMAL HIGH (ref 4.0–10.5)

## 2016-09-14 LAB — URINALYSIS, ROUTINE W REFLEX MICROSCOPIC
Bilirubin Urine: NEGATIVE
Glucose, UA: NEGATIVE mg/dL
Hgb urine dipstick: NEGATIVE
Ketones, ur: 5 mg/dL — AB
Nitrite: NEGATIVE
Protein, ur: 100 mg/dL — AB
SPECIFIC GRAVITY, URINE: 1.025 (ref 1.005–1.030)
pH: 7 (ref 5.0–8.0)

## 2016-09-14 LAB — CBG MONITORING, ED
GLUCOSE-CAPILLARY: 302 mg/dL — AB (ref 65–99)
Glucose-Capillary: 122 mg/dL — ABNORMAL HIGH (ref 65–99)
Glucose-Capillary: 199 mg/dL — ABNORMAL HIGH (ref 65–99)

## 2016-09-14 LAB — TYPE AND SCREEN
ABO/RH(D): A POS
Antibody Screen: NEGATIVE

## 2016-09-14 LAB — ABO/RH: ABO/RH(D): A POS

## 2016-09-14 LAB — LIPASE, BLOOD: LIPASE: 20 U/L (ref 11–51)

## 2016-09-14 MED ORDER — INSULIN ASPART 100 UNIT/ML ~~LOC~~ SOLN
0.0000 [IU] | SUBCUTANEOUS | Status: DC
  Administered 2016-09-14: 2 [IU] via SUBCUTANEOUS
  Administered 2016-09-15 – 2016-09-16 (×4): 1 [IU] via SUBCUTANEOUS
  Administered 2016-09-17: 3 [IU] via SUBCUTANEOUS
  Administered 2016-09-17: 2 [IU] via SUBCUTANEOUS
  Administered 2016-09-17 (×2): 3 [IU] via SUBCUTANEOUS
  Administered 2016-09-17: 5 [IU] via SUBCUTANEOUS
  Administered 2016-09-17: 2 [IU] via SUBCUTANEOUS
  Administered 2016-09-18: 3 [IU] via SUBCUTANEOUS
  Administered 2016-09-18 (×2): 2 [IU] via SUBCUTANEOUS
  Administered 2016-09-18: 5 [IU] via SUBCUTANEOUS
  Filled 2016-09-14 (×2): qty 1

## 2016-09-14 MED ORDER — SODIUM CHLORIDE 0.9 % IV SOLN
INTRAVENOUS | Status: DC
  Administered 2016-09-14 – 2016-09-15 (×3): via INTRAVENOUS

## 2016-09-14 MED ORDER — ONDANSETRON HCL 4 MG/2ML IJ SOLN: 4.0000 mg | Freq: Four times a day (QID) | INTRAMUSCULAR | Status: DC | PRN

## 2016-09-14 MED ORDER — SODIUM CHLORIDE 0.9% FLUSH
3.0000 mL | Freq: Two times a day (BID) | INTRAVENOUS | Status: DC
Start: 2016-09-14 — End: 2016-09-19
  Administered 2016-09-15 – 2016-09-19 (×8): 3 mL via INTRAVENOUS

## 2016-09-14 MED ORDER — METOPROLOL TARTRATE 5 MG/5ML IV SOLN
5.0000 mg | Freq: Two times a day (BID) | INTRAVENOUS | Status: DC
  Administered 2016-09-15 – 2016-09-16 (×3): 5 mg via INTRAVENOUS
  Filled 2016-09-14 (×3): qty 5

## 2016-09-14 MED ORDER — SODIUM CHLORIDE 0.9 % IV BOLUS (SEPSIS)
500.0000 mL | Freq: Once | INTRAVENOUS | Status: AC
  Administered 2016-09-14: 500 mL via INTRAVENOUS

## 2016-09-14 MED ORDER — CALCIUM CARBONATE-VITAMIN D 500-200 MG-UNIT PO TABS
2.0000 | ORAL_TABLET | Freq: Every day | ORAL | Status: DC
  Administered 2016-09-15 – 2016-09-19 (×5): 2
  Filled 2016-09-14 (×5): qty 2

## 2016-09-14 MED ORDER — PANTOPRAZOLE SODIUM 40 MG IV SOLR
40.0000 mg | Freq: Once | INTRAVENOUS | Status: AC
  Administered 2016-09-14: 40 mg via INTRAVENOUS
  Filled 2016-09-14: qty 40

## 2016-09-14 MED ORDER — MORPHINE SULFATE (PF) 2 MG/ML IV SOLN
2.0000 mg | INTRAVENOUS | Status: DC | PRN
  Administered 2016-09-14 – 2016-09-18 (×8): 2 mg via INTRAVENOUS
  Filled 2016-09-14 (×11): qty 1

## 2016-09-14 MED ORDER — PANTOPRAZOLE SODIUM 40 MG IV SOLR
40.0000 mg | Freq: Two times a day (BID) | INTRAVENOUS | Status: DC
  Administered 2016-09-15: 40 mg via INTRAVENOUS

## 2016-09-14 MED ORDER — INSULIN GLARGINE 100 UNIT/ML ~~LOC~~ SOLN
10.0000 [IU] | Freq: Every day | SUBCUTANEOUS | Status: DC
  Administered 2016-09-15 – 2016-09-17 (×4): 10 [IU] via SUBCUTANEOUS
  Filled 2016-09-14 (×4): qty 0.1

## 2016-09-14 MED ORDER — ATORVASTATIN CALCIUM 40 MG PO TABS
40.0000 mg | ORAL_TABLET | Freq: Every day | ORAL | Status: DC
  Administered 2016-09-16 – 2016-09-18 (×3): 40 mg
  Filled 2016-09-14 (×4): qty 1

## 2016-09-14 MED ORDER — PANTOPRAZOLE SODIUM 40 MG IV SOLR
40.0000 mg | Freq: Two times a day (BID) | INTRAVENOUS | Status: DC
  Administered 2016-09-14 – 2016-09-19 (×10): 40 mg via INTRAVENOUS
  Filled 2016-09-14 (×11): qty 40

## 2016-09-14 MED ORDER — ONDANSETRON HCL 4 MG/2ML IJ SOLN
4.0000 mg | Freq: Once | INTRAMUSCULAR | Status: AC
  Administered 2016-09-14: 4 mg via INTRAVENOUS
  Filled 2016-09-14: qty 2
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
## 2016-09-14 NOTE — ED Notes (Signed)
ALYSSA RN WITNESSED SHERRI POA VERBALIZED CAN THROW AWAY TURTLE SHELL (BACK BRACE). PLACED IN TRASH CAN IN PT'S ROOM.

## 2016-09-14 NOTE — ED Notes (Addendum)
TSO HERE TO MEASURE FOR BACK BRACE. FAMILY PRESENT. SLING RUE AND IMMOBILIZER TO LLE PLACED BY BENEDICT ORTHO TECH. PT TOLERATED

## 2016-09-14 NOTE — ED Notes (Signed)
GI PA EVALUATED PT. FAMILY PRESENT

## 2016-09-14 NOTE — ED Notes (Signed)
Family at bedside. 

## 2016-09-14 NOTE — ED Provider Notes (Signed)
[facility]-EMERGENCY DEPT Provider Note   CSN: [id number] Arrival date & time: 09/14/16  0631     History   Chief Complaint Chief Complaint  Patient presents with  . Nausea  . Emesis    HPI Diana Hill is a 83 y.o. female.  The history is provided by the patient. No language interpreter was used.  Emesis      Diana Hill is a 83 y.o. female who presents to the Emergency Department complaining of vomiting.  Level V caveat due to confusion, dementia.  Per report pt is here for 6 hours of vomiting. Patient reports vomiting and diarrhea. She denies any pain.  She is being treated for the flu, did not have a flu test. Vomiting blood overnight.  She receives tube feeds.  Baseline mental status she is oriented to person.  She is on Xarelto  History reviewed. No pertinent past medical history.  Patient Active Problem List   Diagnosis Date Noted  . Closed displaced transverse fracture of shaft of right humerus     Past Surgical History:  Procedure Laterality Date  . IR GENERIC HISTORICAL  07/10/2016   IR GASTROSTOMY TUBE MOD SED 07/10/2016 Irish Lack, MD [facility]-INTERV RAD    OB History    No data available       Home Medications    Prior to Admission medications   Not on File    Family History No family history on file.  Social History Social History  Substance Use Topics  . Smoking status: Not on file  . Smokeless tobacco: Not on file  . Alcohol use Not on file     Allergies   Doxepin; Halcion [triazolam]; and Pletal [cilostazol]   Review of Systems Review of Systems  Gastrointestinal: Positive for vomiting.  All other systems reviewed and are negative.    Physical Exam Updated Vital Signs BP 128/67 (BP Location: Left Arm)   Pulse 120   Temp 97.2 F (36.2 C) (Oral)   Resp 18   SpO2 94%   Physical Exam  Constitutional: She is oriented to person, place, and time. She appears well-developed and well-nourished.  Ill-appearing  HENT:  Head:  Normocephalic and atraumatic.  Dry mucous membranes  Cardiovascular: Regular rhythm.   No murmur heard. Tachycardic  Pulmonary/Chest: Effort normal. No respiratory distress.  Occasional rhonchi bilaterally  Abdominal: Soft. There is no rebound and no guarding.  Mild diffuse abdominal tenderness.  Gastrostomy tube in upper abdomen without surrounding bleeding.    Musculoskeletal: She exhibits no tenderness.  1+ pitting edema in the left lower extremity.  Neurological: She is alert and oriented to person, place, and time.  Skin: Skin is warm and dry.  Psychiatric: She has a normal mood and affect. Her behavior is normal.  Nursing note and vitals reviewed.    ED Treatments / Results  Labs (all labs ordered are listed, but only abnormal results are displayed) Labs Reviewed - No data to display  EKG  EKG Interpretation None       Radiology No results found.  Procedures Procedures (including critical care time)  Medications Ordered in ED Medications - No data to display   Initial Impression / Assessment and Plan / ED Course  I have reviewed the triage vital signs and the nursing notes.  Pertinent labs & imaging results that were available during my care of the patient were reviewed by me and considered in my medical decision making (see chart for details).     Patient  here for vomiting blood from nursing facility. History is very limited due to patient's dementia and limited history available from the nursing facility. Attempted to contact family but there is no answer on provided cell phone number. Her PEG tube was placed to intermittent suction and 200 mL of coffee-ground fluid was returned.  Providing dose of protonix for GI bleed.  GI consulted for GI bleed. Family available at bedside after initial evaluation - they state that her PEG tube has been pulled and replaced at the facility recently.    Hospitalist consulted for admission for further treatment.   Final  Clinical Impressions(s) / ED Diagnoses   Final diagnoses:  None    New Prescriptions New Prescriptions   No medications on file     Tilden FossaElizabeth Raja Liska, MD 09/14/16 [phone]

## 2016-09-14 NOTE — Progress Notes (Signed)
CSW spoke to pt, pt was not oriented.  CSW viewed assessment made by Celso SickleKimberly Long, LCSW on 1/25 and spoke to pt's daughter Shauna HughCherri Howlett at ph: [phone].  CSW confirmed that the pt's daughter's wish is that [facility].  Pt did not state why, but per previous CSW's assesment/plan note the pt's daughter did not view [facility] as a [facility] discharge.  Pt's daughter stated she would like referral to be sent to facilities in the [place] area, closer to the pt's daughter's home.  CSW educated the pt's daughter that a PT consult recommending SNF would have to be completed prior to SNF referrals being sent out.  Pt's daughter confirmed she understood. Pt's daughter was appreciative and thanked the CSW.  Dorothe PeaJonathan F. Nashid Pellum, Theresia MajorsLCSWA, LCAS Clinical Social Worker Ph: [phone]

## 2016-09-14 NOTE — ED Notes (Signed)
URINE AT BEDSIDE IF NEED FOR COLLECTION

## 2016-09-14 NOTE — ED Notes (Signed)
DELAY IN MOVING PT -NEED EVALUATION ON BROKEN RIBS, BROKEN RUE, BROKEN LLE AND BROKEN BACK. DAUGHTER STATES TURTLE LEFT AT NURSING FACILITY. REQUESTED FAMILY BRING TO HOSPITAL.

## 2016-09-14 NOTE — ED Notes (Signed)
ED Provider at bedside. 

## 2016-09-14 NOTE — ED Notes (Signed)
Failed attmpt to collect urine. Pt urinated on bed when rolling.

## 2016-09-14 NOTE — ED Notes (Signed)
FAMILY STATES TURTLE (BACK BRACE ) HAS ARRIVED.
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
## 2016-09-14 NOTE — ED Notes (Addendum)
Jonna NT present performing in and out. Jonna NT not successful with in and out.

## 2016-09-14 NOTE — ED Notes (Signed)
Bed: [id number] Expected date:  Expected time:  Means of arrival:  Comments: EMS  Nausea vomiting

## 2016-09-14 NOTE — ED Notes (Signed)
ADMITTING MD Provider at bedside. 

## 2016-09-14 NOTE — ED Triage Notes (Addendum)
Pt comes to ed, via ems, c/o NV x2 for the past 6hrs, staff verbalize the vomit looks red and coffee ground in nature. Pt comes [facility]. 24 left hand, 4mg  zofran given by ems. Pt has dementia at baseline.  Pt hx diabetes. Allergies doxepin, halcion, pletal. postive for flu. Vs on arrival 134/79, pulse 114, rr18, 95 spo2 room air  cbg 261. Pt is on blood thinners.

## 2016-09-14 NOTE — ED Notes (Signed)
EDP REES PLACED PEG TO LIWS-COFFEE GROUND COLOR FLUID RETURNED

## 2016-09-14 NOTE — Clinical Social Work Note (Signed)
Clinical Social Work Assessment  Patient Details  Name: Diana Hill MRN: [id number] Date of Birth: [birth year]  Date of referral:  09/14/16               Reason for consult:  Facility Placement                Permission sought to share information with:    Permission granted to share information::     Name::        Agency::     Relationship::     Contact Information:     Housing/Transportation Living arrangements for the past 2 months:  [employer] Designer, fashion/clothing) Source of Information:  Adult Children Patient Interpreter Needed:  None Criminal Activity/Legal Involvement Pertinent to Current Situation/Hospitalization:  No - Comment as needed Significant Relationships:  Adult Children (Daughter: Personal assistant Niece: Diana Hill) Lives with:  Facility Resident Do you feel safe going back to the place where you live?  No (Patient's daughter reports that she does not feel safe with patient returning to current SNF) Need for family participation in patient care:  Yes (Comment)  Care giving concerns:  Patient's daughter reports that she does not feel safe with patient returning to current SNF [facility]). Patient's daughter reports that the facility called her this morning 09/14/2016 reporting that the patient had been throwing up all night. Patient's daughter reported that she inquired about why she had not been contacted earlier and facility staff member reported that they just didn't call her.    Social Worker assessment / plan:  CSW spoke with patient's family (Daughter:Diana Hill & Niece:Diana Hill) regarding consult for nursing home issues. Patient asleep. Patient's daughter reports that patient's SNF [facility] has been threatening to put patient out due to overdue payment that is not covered by Medicare and that she is interested in having patient placed in a different SNF when discharged. Patient's daughter reports that she is interested in  having patient placed in a SNF located in [place], [place] where she can be available to patient in a quicker time frame versus current SNF. CSW explained placement process to patient's daughter and niece and informed them about an inpatient CSW facilitating placement for patient. Patient's daughter informed CSW that patient will have an income after patient's husband's estate is sold.   Employment status:    Insurance information:  Medicare PT Recommendations:  Not assessed at this time Information / Referral to community resources:     Patient/Family's Response to care:  Patient's daughter reports that she does not feel safe with patient returning to [employer] at discharge. Patient's daughter reports that she is interested in having patient placed in SNF located in [place] closer to her so she can be active in patient's care.   Patient/Family's Understanding of and Emotional Response to Diagnosis, Current Treatment, and Prognosis:  Patient asleep. Patient's daughter reported that she is aware that patient is being admitted and that she will be present for patient's treatment.   Emotional Assessment Appearance:  Appears stated age Attitude/Demeanor/Rapport:  Unable to Assess (patient sleeping) Affect (typically observed):  Unable to Assess (patient sleeping) Orientation:   (unable to assess, patient sleeping) Alcohol / Substance use:  Not Applicable Psych involvement (Current and /or in the community):  No (Comment)  Discharge Needs  Concerns to be addressed:  Discharge Planning Concerns Readmission within the last 30 days:  No Current discharge risk:  None Barriers to Discharge:  No Barriers Identified  Antionette PolesKimberly L Jasmia Angst, LCSW 09/14/2016, 1:36 PM

## 2016-09-14 NOTE — ED Notes (Signed)
REQUESTED JACOB RN TO ASSIST WITH US IV WITH LAB DRAW. RICK RN MADE AWARE OF NEED FOR 2ND IV AND PENDING LAB WORK.

## 2016-09-14 NOTE — ED Notes (Signed)
ATTEMPT X 1 IV START WITH LAB DRAW NOT SUCCESSFUL. PT TOLERATED. INFORMED PT AND FAMILY WILL ASK FOR ASSISTANCE WITH US IV.

## 2016-09-14 NOTE — Progress Notes (Signed)
[facility] ED Cm went to see pt while 2 female visitors present and admission RN was completing her assessment  ED CM noted pt did not have a pcp listed -Admission RN had obtained pcp name as [facility]  While present 2 females voiced concern about snf [facility] Admission Rn entered SW consult for issues with facility  Answered 2 female questions ED RN Morrie SheldonAshley aware

## 2016-09-14 NOTE — ED Notes (Signed)
ORTHO TECH BENEDICT PRESENT TO EVALUATE -TURTLE SHELL (BROKEN BACK ),  IMMOBILIZER FOR LLE, AND POTENTIAL SHOULD IMMOBILIZER FOR RUE (BROKEN ARM). ADMITTING MD PAGED

## 2016-09-15 DIAGNOSIS — K920 Hematemesis: Secondary | ICD-10-CM

## 2016-09-15 DIAGNOSIS — K219 Gastro-esophageal reflux disease without esophagitis: Secondary | ICD-10-CM

## 2016-09-15 DIAGNOSIS — R633 Feeding difficulties, unspecified: Secondary | ICD-10-CM

## 2016-09-15 DIAGNOSIS — F0390 Unspecified dementia without behavioral disturbance: Secondary | ICD-10-CM

## 2016-09-15 DIAGNOSIS — I10 Essential (primary) hypertension: Secondary | ICD-10-CM

## 2016-09-15 DIAGNOSIS — I82402 Acute embolism and thrombosis of unspecified deep veins of left lower extremity: Secondary | ICD-10-CM

## 2016-09-15 LAB — GLUCOSE, CAPILLARY
GLUCOSE-CAPILLARY: 107 mg/dL — AB (ref 65–99)
GLUCOSE-CAPILLARY: 107 mg/dL — AB (ref 65–99)
Glucose-Capillary: 110 mg/dL — ABNORMAL HIGH (ref 65–99)
Glucose-Capillary: 140 mg/dL — ABNORMAL HIGH (ref 65–99)
Glucose-Capillary: 96 mg/dL (ref 65–99)

## 2016-09-15 LAB — CBC
HEMATOCRIT: 34.8 % — AB (ref 36.0–46.0)
HEMOGLOBIN: 10.9 g/dL — AB (ref 12.0–15.0)
MCH: 30.3 pg (ref 26.0–34.0)
MCHC: 31.3 g/dL (ref 30.0–36.0)
MCV: 96.7 fL (ref 78.0–100.0)
PLATELETS: 248 10*3/uL (ref 150–400)
RBC: 3.6 MIL/uL — AB (ref 3.87–5.11)
RDW: 15.5 % (ref 11.5–15.5)
WBC: 7.8 10*3/uL (ref 4.0–10.5)

## 2016-09-15 LAB — BASIC METABOLIC PANEL
ANION GAP: 7 (ref 5–15)
BUN: 26 mg/dL — ABNORMAL HIGH (ref 6–20)
CALCIUM: 8.5 mg/dL — AB (ref 8.9–10.3)
CO2: 30 mmol/L (ref 22–32)
Chloride: 103 mmol/L (ref 101–111)
Creatinine, Ser: 0.56 mg/dL (ref 0.44–1.00)
GLUCOSE: 136 mg/dL — AB (ref 65–99)
Potassium: 3.8 mmol/L (ref 3.5–5.1)
SODIUM: 140 mmol/L (ref 135–145)

## 2016-09-15 LAB — MRSA PCR SCREENING: MRSA BY PCR: NEGATIVE

## 2016-09-15 MED ORDER — DEXTROSE 5 % IV SOLN
1.0000 g | INTRAVENOUS | Status: DC
  Administered 2016-09-15: 1 g via INTRAVENOUS
  Filled 2016-09-15 (×2): qty 10

## 2016-09-15 MED ORDER — SODIUM CHLORIDE 0.9 % IV SOLN
INTRAVENOUS | Status: DC
  Administered 2016-09-15 – 2016-09-16 (×2): via INTRAVENOUS

## 2016-09-15 MED ORDER — HEPARIN (PORCINE) IN NACL 100-0.45 UNIT/ML-% IJ SOLN
1050.0000 [IU]/h | INTRAMUSCULAR | Status: DC
  Administered 2016-09-15: 1050 [IU]/h via INTRAVENOUS
  Filled 2016-09-15: qty 250

## 2016-09-15 MED ORDER — POLYETHYLENE GLYCOL 3350 17 G PO PACK
17.0000 g | PACK | Freq: Every day | ORAL | Status: DC
  Administered 2016-09-16 – 2016-09-19 (×4): 17 g
  Filled 2016-09-15 (×4): qty 1

## 2016-09-15 NOTE — Progress Notes (Signed)
ANTICOAGULATION CONSULT NOTE - Initial Consult  Pharmacy Consult for Heparin Indication: DVT  Allergies  Allergen Reactions  . Doxepin Other (See Comments)    Unknown reaction  . Halcion [Triazolam] Other (See Comments)    unknown reaction  . Pletal [Cilostazol] Other (See Comments)    Unknown reaction    Patient Measurements: Height: 5\' 4"  (162.6 cm) Weight: 142 lb 12.8 oz (64.8 kg) IBW/kg (Calculated) : 54.7  Heparin dosing weight:  Total body weight  Vital Signs: Temp: 98.2 F (36.8 C) (01/26 1400) Temp Source: Oral (01/26 1400) BP: 124/61 (01/26 1400) Pulse Rate: 86 (01/26 1400)  Labs:  Recent Labs  09/14/16 0814 09/14/16 2008 09/15/16 0806 09/15/16 0831  HGB 13.0 11.1*  --  10.9*  HCT 39.4 34.6*  --  34.8*  PLT 308 271  --  248  CREATININE 0.66  --  0.56  --     Estimated Creatinine Clearance: 46 mL/min (by C-G formula based on SCr of 0.56 mg/dL).   Medical History: Past Medical History:  Diagnosis Date  . Atherosclerotic heart disease   . Closed left tibial fracture    History of  . COPD (chronic obstructive pulmonary disease) (HCC)   . Dementia   . Depression   . Diabetes mellitus without complication (HCC)   . Fracture of right wrist    History of  . Frequent urinary incontinence   . Hyperlipidemia   . Hypertension   . Peripheral vascular disease (HCC)   . Pneumothorax, left    History of  . Right supracondylar humerus fracture    History of  . Unsp fracture of first lumbar vertebra, init for clos fx (HCC)     Medications:  Scheduled:  . atorvastatin  40 mg Per Tube Daily  . calcium-vitamin D  2 tablet Per Tube Daily  . cefTRIAXone (ROCEPHIN)  IV  1 g Intravenous Q24H  . insulin aspart  0-9 Units Subcutaneous Q4H  . insulin glargine  10 Units Subcutaneous Daily  . metoprolol  5 mg Intravenous Q12H  . pantoprazole (PROTONIX) IV  40 mg Intravenous Q12H  . polyethylene glycol  17 g Per Tube Daily  . sodium chloride flush  3 mL  Intravenous Q12H   Infusions:  . sodium chloride 50 mL/hr at 09/15/16 1345   PRN: morphine injection, ondansetron (ZOFRAN) IV  Assessment: [age] yoF presented to ED on 1/25 with hematemesis.  PMH significant for PEG tube d/t dysphagia and recent LLE DVT started on Xarelto 1/13.   On admission, Xarelto was held, but per GI now OK to resume anticoagulation when medically important.  Dr. Waymon AmatoHongalgi has consulted pharmacy to dose Heparin IV x 24 hours then resume Xarelto if she remains stable and has no further s/s bleeding.  PTA Xarelto 15 mg via tube BID (started on 09/02/16, transition to 20mg  once daily on 09/23/16).  Last dose 1/24 at 1800.  Today, 09/15/2016: CBC:  Hgb 10.9, remains low but stable.  Plt 248 SCr 0.56, CrCl ~ 46 ml/min   Goal of Therapy:  Heparin level 0.3-0.7 units/ml aPTT 66-102 seconds Monitor platelets by anticoagulation protocol: Yes   Plan:   No heparin bolus d/t GIB  Start heparin IV infusion at 1050 units/hr  Heparin level 8 hours after starting  Daily heparin level and CBC  Continue to monitor H&H and platelets   Lynann Beaverhristine Faye Sanfilippo PharmD, BCPS Pager [phone] 09/15/2016 5:45 PM

## 2016-09-15 NOTE — Progress Notes (Signed)
OT Cancellation Note  Patient Details Name: Diana Hill MRN: [id number] DOB: 02/22/1931   Cancelled Treatment:    Reason Eval/Treat Not Completed: Other (comment).  Noted, pt is a SNF resident and plan is for her to return to a different SNF. Will defer OT evaluation to that venue.  Laszlo Ellerby 09/15/2016, 8:06 AM  Diana Hill, OTR/L [phone] 09/15/2016
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
## 2016-09-15 NOTE — Clinical Social Work Placement (Signed)
CSW requested that [facility] notes [facility] be faxed over to [facility] nurses station - placed on shadow chart. CSW completed FL2 and sent [facility] out [facility] - will follow-up with bed offers when available. CSW confirmed with Diana BraunKaren at [facility] that patient has been a resident there from 07/21/16 - 09/14/16 (using 55 days).    Diana MaxinKelly Austyn Seier, LCSW [facility] Clinical Social Worker cell #: [phone]    CLINICAL SOCIAL WORK PLACEMENT  NOTE  Date:  09/15/2016  Patient Details  Name: Diana Hill MRN: [id number] Date of Birth: 11/28/1930  Clinical Social Work is seeking post-discharge placement for this patient at the Skilled  Nursing Facility level of care (*CSW will initial, date and re-position this form in  chart as items are completed):  Yes   Patient/family provided with [facility] Clinical Social Work Department's list of facilities offering this level of care within the geographic area requested by the patient (or if unable, by the patient's family).  Yes   Patient/family informed of their freedom to choose among providers that offer the needed level of care, that participate in Medicare, Medicaid or managed care program needed by the patient, have an available bed and are willing to accept the patient.  Yes   Patient/family informed of [facility] ownership interest in [facility] and [facility], as well as of the fact that they are under no obligation to receive care at these facilities.  PASRR submitted to EDS on       PASRR number received on       Existing PASRR number confirmed on 09/15/16     FL2 transmitted to all facilities in geographic area requested by pt/family on 09/15/16     FL2 transmitted to all facilities within larger geographic area on       Patient informed that his/her managed care company has contracts with or will negotiate with certain facilities, including the following:            Patient/family informed of bed offers received.  Patient chooses bed at       Physician recommends and patient chooses bed at      Patient to be transferred to   on  .  Patient to be transferred to facility by       Patient family notified on   of transfer.  Name of family member notified:        PHYSICIAN       Additional Comment:    _______________________________________________ Arlyss RepressHarrison, Deshae Dickison F, LCSW 09/15/2016, 2:53 PM
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
## 2016-09-15 NOTE — Progress Notes (Signed)
[facility] Progress Note  Chief Complaint:   Coffee ground vomitus  Subjective: No complaints. Daughter visiting  Objective:  Vital signs in last 24 hours: Temp:  [97.7 F (36.5 C)-99.3 F (37.4 C)] 97.7 F (36.5 C) (01/26 0615) Pulse Rate:  [86-111] 97 (01/26 0615) Resp:  [15-23] 22 (01/26 0615) BP: (103-154)/(52-90) 154/90 (01/26 0615) SpO2:  [78 %-98 %] 94 % (01/26 0615) Weight:  [142 lb 12.8 oz (64.8 kg)] 142 lb 12.8 oz (64.8 kg) (01/26 0210)   General:   Alert, obese white female in NAD EENT:  Normal hearing, left eye basically closed.  Heart:  Regular rate and rhythm Pulm: Normal respiratory effort, congested cough . Abdomen:  Soft, nondistended, nontender.  PEG connected to wall suction but no output in cannister, just a small amount of dark red blood in tubing. External bumper too tight, I was able to loosen the bumper. Site looks okay. Normal bowel sounds, no masses felt.    Neurologic:  Alert , not really engaging in conversations, somewhat confused. Psych:  pleasant and cooperative.    Intake/Output from previous day: 01/25 0701 - 01/26 0700 In: 1480 [I.V.:980; IV Piggyback:500] Out: 700 [Urine:600; Drains:100] Intake/Output this shift: No intake/output data recorded.  Lab Results:  Recent Labs  09/14/16 0814 09/14/16 2008 09/15/16 0831  WBC 13.0* 8.9 7.8  HGB 13.0 11.1* 10.9*  HCT 39.4 34.6* 34.8*  PLT 308 271 248   BMET  Recent Labs  09/14/16 0814 09/15/16 0806  NA 136 140  K 4.3 3.8  CL 94* 103  CO2 28 30  GLUCOSE 324* 136*  BUN 30* 26*  CREATININE 0.66 0.56  CALCIUM 9.4 8.5*   LFT  Recent Labs  09/14/16 0814  PROT 7.1  ALBUMIN 3.5  AST 21  ALT 15  ALKPHOS 78  BILITOT 0.1*    Assessment / Plan:  83 yo female with coffee ground emesis on anticoagulation. Suspect gastric wall irritation / ulceration from snug gastrostomy tube. PEG connected to LIS, no significant bloody output. I loosen the external bumper  on PEG, hopefull this will help.  Hgb stable overnight at 10.9 -Continue BID PPI -Monitor H&H and for overt bleeding. Trying to avoid endoscopic procedures given advanced age / co-morbidities. -Small amount of blood in PEG tubing now. Asked RN to irrigate. If no significant bleeding then should disconnect from suction. Ok to use PEG for meds now .   Principal Problem:   Upper GI bleed Active Problems:   Closed left tibial fracture   COPD (chronic obstructive pulmonary disease) (HCC)   Dementia   Hypertension   Right supracondylar humerus fracture   Unsp fracture of first lumbar vertebra, init for clos fx (HCC)   Hyperlipidemia   Diabetes mellitus without complication (HCC)   DVT (deep venous thrombosis) (HCC)   GI bleed    LOS: 1 day   Willette Cluster NP 09/15/2016, 10:44 AM Pager number [phone]  GI ATTENDING  Interval history and data reviewed. Patient personally seen and examined. Family in room. Drifting hemoglobin with hydration noted. No significant bleeding. She does have a history of significant reflux symptoms. PPI previously. PEG tube loosened. RECOMMENDATIONS 1. The patient should remain on twice a day PPI indefinitely 2. Please resume patient tube feeds. Per primary service 3. Elevate the head of bed greater than 35 while feeding to avoid aspiration 4. Okay to resume anticoagulation or antiplatelet therapy that is felt to be medically important 5. No plans for EGD at  this time. We're available needed for questions or problems. Reviewed with family. We'll sign off. Thank you  Wilhemina BonitoJohn N. Eda KeysPerry, Jr., M.D. [facility] Division of Gastroenterology

## 2016-09-15 NOTE — Progress Notes (Signed)
[facility] and ST for prior to this admission. Records in shadow chart. Melton Alarana A Chayna Surratt, RN

## 2016-09-15 NOTE — NC FL2 (Signed)
[place] MEDICAID FL2 LEVEL OF CARE SCREENING TOOL     IDENTIFICATION  Patient Name: Diana Hill Birthdate: 10/14/1930 Sex: female Admission Date (Current Location): 09/14/2016  [facility] and [place] Number:  Producer, television/film/video and Address:  [facility],  [address] [place] [address]      Provider Number: [id number]  Attending Physician Name and Address:  Elease Etienne, MD  Relative Name and Phone Number:       Current Level of Care: Hospital Recommended Level of Care: Skilled Nursing Facility Prior Approval Number:    Date Approved/Denied:   PASRR Number: [id number]  Discharge Plan: SNF    Current Diagnoses: Patient Active Problem List   Diagnosis Date Noted  . Acute upper GI bleed 09/14/2016  . Closed left tibial fracture 09/14/2016  . COPD (chronic obstructive pulmonary disease) (HCC) 09/14/2016  . Dementia 09/14/2016  . Hypertension 09/14/2016  . Right supracondylar humerus fracture 09/14/2016  . Unsp fracture of first lumbar vertebra, init for clos fx (HCC) 09/14/2016  . Hyperlipidemia 09/14/2016  . Diabetes mellitus without complication (HCC) 09/14/2016  . DVT (deep venous thrombosis) (HCC) 09/14/2016  . GI bleed 09/14/2016  . Coffee ground emesis   . Gastrostomy tube skin breakdown (HCC)   . Chronic anticoagulation   . Closed displaced transverse fracture of shaft of right humerus     Orientation RESPIRATION BLADDER Height & Weight     Self  Normal Incontinent, Indwelling catheter Weight: 142 lb 12.8 oz (64.8 kg) Height:  5\' 4"  (162.6 cm)  BEHAVIORAL SYMPTOMS/MOOD NEUROLOGICAL BOWEL NUTRITION STATUS      Continent Diet  AMBULATORY STATUS COMMUNICATION OF NEEDS Skin   Extensive Assist Verbally Normal                       Personal Care Assistance Level of Assistance  Bathing, Dressing Bathing Assistance: Limited assistance   Dressing Assistance: Limited assistance     Functional Limitations Info              SPECIAL CARE FACTORS FREQUENCY  PT (By licensed PT), OT (By licensed OT)     PT Frequency: 5 OT Frequency: 5            Contractures      Additional Factors Info  Code Status, Allergies Code Status Info: DNR Allergies Info: Doxepin, Halcion Triazolam, Pletal Cilostazol           Current Medications (09/15/2016):  This is the current hospital active medication list Current Facility-Administered Medications  Medication Dose Route Frequency Provider Last Rate Last Dose  . 0.9 %  sodium chloride infusion   Intravenous Continuous Elease Etienne, MD      . atorvastatin (LIPITOR) tablet 40 mg  40 mg Per Tube Daily Tyrone Nine, MD      . calcium-vitamin D (OSCAL WITH D) 500-200 MG-UNIT per tablet 2 tablet  2 tablet Per Tube Daily Tyrone Nine, MD   2 tablet at 09/15/16 1058  . insulin aspart (novoLOG) injection 0-9 Units  0-9 Units Subcutaneous Q4H Tyrone Nine, MD   1 Units at 09/15/16 [phone]  . insulin glargine (LANTUS) injection 10 Units  10 Units Subcutaneous Daily Tyrone Nine, MD   10 Units at 09/15/16 1041  . metoprolol (LOPRESSOR) injection 5 mg  5 mg Intravenous Q12H Tyrone Nine, MD   5 mg at 09/15/16 1038  . morphine 2 MG/ML injection 2 mg  2 mg  Intravenous Q1H PRN Tyrone Nineyan B Grunz, MD   2 mg at 09/15/16 1255  . ondansetron (ZOFRAN) injection 4 mg  4 mg Intravenous Q6H PRN Tyrone Nineyan B Grunz, MD      . pantoprazole (PROTONIX) injection 40 mg  40 mg Intravenous Q12H Dianah FieldSarah J Gribbin, PA-C   40 mg at 09/15/16 1040  . polyethylene glycol (MIRALAX / GLYCOLAX) packet 17 g  17 g Per Tube Daily Rosalin HawkingZeba Anwar, MD      . sodium chloride flush (NS) 0.9 % injection 3 mL  3 mL Intravenous Q12H Tyrone Nineyan B Grunz, MD   3 mL at 09/15/16 0234     Discharge Medications: Please see discharge summary for a list of discharge medications.  Relevant Imaging Results:  Relevant Lab Results:   Additional Information SSN: [id number]  Arlyss RepressHarrison, Suprina [place] F, LCSW

## 2016-09-15 NOTE — Progress Notes (Signed)
Addendum  Dr. Marina GoodellPerry, GI recommendations appreciated> OK to resume anticoagulation. Will start IV Heparin per pharmacy and monitor over next 24 hours for recurrence or worsening bleeding and if none, then will resume prior to admission Xarelto. Discussed with pharmacist.  Marcellus ScottHONGALGI,ANAND, MD, FACP, FHM. [facility] Pager [phone]  If 7PM-7AM, please contact night-coverage [email or web address] Password [email or web address] 09/15/2016, 5:46 PM

## 2016-09-16 ENCOUNTER — Encounter (HOSPITAL_COMMUNITY): Payer: Self-pay

## 2016-09-16 DIAGNOSIS — N390 Urinary tract infection, site not specified: Secondary | ICD-10-CM

## 2016-09-16 DIAGNOSIS — B952 Enterococcus as the cause of diseases classified elsewhere: Secondary | ICD-10-CM

## 2016-09-16 LAB — GLUCOSE, CAPILLARY
GLUCOSE-CAPILLARY: 106 mg/dL — AB (ref 65–99)
GLUCOSE-CAPILLARY: 149 mg/dL — AB (ref 65–99)
Glucose-Capillary: 136 mg/dL — ABNORMAL HIGH (ref 65–99)
Glucose-Capillary: 89 mg/dL (ref 65–99)
Glucose-Capillary: 91 mg/dL (ref 65–99)
Glucose-Capillary: 98 mg/dL (ref 65–99)

## 2016-09-16 LAB — CBC
HEMATOCRIT: 33.9 % — AB (ref 36.0–46.0)
Hemoglobin: 10.8 g/dL — ABNORMAL LOW (ref 12.0–15.0)
MCH: 30.9 pg (ref 26.0–34.0)
MCHC: 31.9 g/dL (ref 30.0–36.0)
MCV: 97.1 fL (ref 78.0–100.0)
PLATELETS: 238 10*3/uL (ref 150–400)
RBC: 3.49 MIL/uL — ABNORMAL LOW (ref 3.87–5.11)
RDW: 15.4 % (ref 11.5–15.5)
WBC: 7 10*3/uL (ref 4.0–10.5)

## 2016-09-16 LAB — HEPARIN LEVEL (UNFRACTIONATED)
Heparin Unfractionated: 0.77 IU/mL — ABNORMAL HIGH (ref 0.30–0.70)
Heparin Unfractionated: 1.18 IU/mL — ABNORMAL HIGH (ref 0.30–0.70)

## 2016-09-16 LAB — APTT: aPTT: 135 seconds — ABNORMAL HIGH (ref 24–36)

## 2016-09-16 MED ORDER — JEVITY 1.2 CAL PO LIQD
1000.0000 mL | ORAL | Status: DC
  Administered 2016-09-16 – 2016-09-18 (×4): 1000 mL
  Filled 2016-09-16 (×5): qty 1000

## 2016-09-16 MED ORDER — METOPROLOL TARTRATE 25 MG PO TABS: 12.5000 mg | ORAL_TABLET | Freq: Two times a day (BID) | ORAL | Status: DC

## 2016-09-16 MED ORDER — AMOXICILLIN 250 MG/5ML PO SUSR
500.0000 mg | Freq: Three times a day (TID) | ORAL | Status: DC
  Administered 2016-09-16 – 2016-09-19 (×10): 500 mg
  Filled 2016-09-16 (×13): qty 10

## 2016-09-16 MED ORDER — RIVAROXABAN 20 MG PO TABS: 20.0000 mg | ORAL_TABLET | Freq: Every day | ORAL | Status: DC

## 2016-09-16 MED ORDER — METOPROLOL TARTRATE 25 MG PO TABS
12.5000 mg | ORAL_TABLET | Freq: Two times a day (BID) | ORAL | Status: DC
  Administered 2016-09-16 – 2016-09-19 (×6): 12.5 mg
  Filled 2016-09-16 (×7): qty 1

## 2016-09-16 MED ORDER — RIVAROXABAN 15 MG PO TABS
15.0000 mg | ORAL_TABLET | Freq: Two times a day (BID) | ORAL | Status: DC
  Administered 2016-09-16 – 2016-09-19 (×6): 15 mg
  Filled 2016-09-16 (×6): qty 1

## 2016-09-16 MED ORDER — HEPARIN (PORCINE) IN NACL 100-0.45 UNIT/ML-% IJ SOLN: 600.0000 [IU]/h | INTRAMUSCULAR | Status: DC

## 2016-09-16 MED ORDER — OXYBUTYNIN CHLORIDE 5 MG PO TABS
5.0000 mg | ORAL_TABLET | Freq: Two times a day (BID) | ORAL | Status: DC
  Administered 2016-09-16 – 2016-09-19 (×7): 5 mg
  Filled 2016-09-16 (×7): qty 1

## 2016-09-16 MED ORDER — ADULT MULTIVITAMIN LIQUID CH
15.0000 mL | Freq: Every day | ORAL | Status: DC
  Administered 2016-09-16 – 2016-09-19 (×4): 15 mL
  Filled 2016-09-16 (×4): qty 15

## 2016-09-16 MED ORDER — HEPARIN (PORCINE) IN NACL 100-0.45 UNIT/ML-% IJ SOLN: 950.0000 [IU]/h | INTRAMUSCULAR | Status: DC

## 2016-09-16 MED ORDER — DONEPEZIL HCL 5 MG PO TABS
5.0000 mg | ORAL_TABLET | Freq: Every day | ORAL | Status: DC
  Administered 2016-09-16 – 2016-09-18 (×3): 5 mg
  Filled 2016-09-16 (×4): qty 1

## 2016-09-16 MED ORDER — FENTANYL 12 MCG/HR TD PT72
12.5000 ug | MEDICATED_PATCH | TRANSDERMAL | Status: DC
  Administered 2016-09-16 – 2016-09-19 (×2): 12.5 ug via TRANSDERMAL
  Filled 2016-09-16 (×2): qty 1

## 2016-09-16 MED ORDER — TRAZODONE HCL 50 MG PO TABS
50.0000 mg | ORAL_TABLET | Freq: Every day | ORAL | Status: DC
  Administered 2016-09-16 – 2016-09-18 (×3): 50 mg
  Filled 2016-09-16 (×3): qty 1

## 2016-09-16 NOTE — Progress Notes (Signed)
Brief Nutrition Note  Consult received for enteral/tube feeding initiation and management.  Adult Enteral Nutrition Protocol initiated. Full assessment to follow.  Admitting Dx: Acute upper GI bleed [K92.2]; related to gastric wall irritation/ulceration from snug gastrostomy tube  Body mass index is 24.55 kg/m.  Normal weight.  Labs:   Recent Labs Lab 09/14/16 0814 09/15/16 0806  NA 136 140  K 4.3 3.8  CL 94* 103  CO2 28 30  BUN 30* 26*  CREATININE 0.66 0.56  CALCIUM 9.4 8.5*  GLUCOSE 324* 136*    [address]Cate Alvilda Mckenna MS, RD, LDN [phone] Pager  [phone] Weekend/On-Call Pager

## 2016-09-16 NOTE — Clinical Social Work Note (Signed)
CSW spoke with Diana Hill (husband of Diana Hill), and bed offers provided. CSW could hear [name] in the background taking the information and having her husband ask CSW questions. CSW will continue to follow and facilitate discharge to a skilled facility when medically stable.  Diana Hill, MSW, LCSW Licensed Clinical Social Worker Clinical Social Work Department [facility] [phone]
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
## 2016-09-16 NOTE — Progress Notes (Signed)
SLP Cancellation Note  Patient Details Name: Diana Hill MRN: [id number] DOB: [birth year]   Cancelled treatment:       Reason Eval/Treat Not Completed: Other (comment) (Spoke with nursing; stated BSE could be deferred until 09/17/16); pt has PEG; palliative has been consulted; nutritionally supported via PEG.   Jermisha Hoffart,PAT, M.S., CCC-SLP 09/16/2016, 4:09 PM

## 2016-09-16 NOTE — Progress Notes (Signed)
ANTICOAGULATION CONSULT NOTE -    Pharmacy Consult for Heparin Indication: LLE DVT  Allergies  Allergen Reactions  . Doxepin Other (See Comments)    Unknown reaction  . Halcion [Triazolam] Other (See Comments)    unknown reaction  . Pletal [Cilostazol] Other (See Comments)    Unknown reaction    Patient Measurements: Height: 5\' 4"  (162.6 cm) Weight: 143 lb (64.9 kg) IBW/kg (Calculated) : 54.7  Heparin dosing weight:  Total body weight  Vital Signs: Temp: 97.8 F (36.6 C) (01/27 0444) Temp Source: Oral (01/27 0444) BP: 150/63 (01/27 0444) Pulse Rate: 83 (01/27 0444)  Labs:  Recent Labs  09/14/16 0814 09/14/16 2008 09/15/16 0806 09/15/16 0831 09/16/16 0229 09/16/16 1146  HGB 13.0 11.1*  --  10.9* 10.8*  --   HCT 39.4 34.6*  --  34.8* 33.9*  --   PLT 308 271  --  248 238  --   APTT  --   --   --   --  135*  --   HEPARINUNFRC  --   --   --   --  0.77* 1.18*  CREATININE 0.66  --  0.56  --   --   --     Estimated Creatinine Clearance: 46 mL/min (by C-G formula based on SCr of 0.56 mg/dL).   Medical History: Past Medical History:  Diagnosis Date  . Atherosclerotic heart disease   . Closed left tibial fracture    History of  . COPD (chronic obstructive pulmonary disease) (HCC)   . Dementia   . Depression   . Diabetes mellitus without complication (HCC)   . Fracture of right wrist    History of  . Frequent urinary incontinence   . Hyperlipidemia   . Hypertension   . Peripheral vascular disease (HCC)   . Pneumothorax, left    History of  . Right supracondylar humerus fracture    History of  . Unsp fracture of first lumbar vertebra, init for clos fx (HCC)     Medications:  Scheduled:  . amoxicillin  500 mg Per Tube Q8H  . atorvastatin  40 mg Per Tube Daily  . calcium-vitamin D  2 tablet Per Tube Daily  . donepezil  5 mg Per Tube QHS  . fentaNYL  12.5 mcg Transdermal Q72H  . insulin aspart  0-9 Units Subcutaneous Q4H  . insulin glargine  10 Units  Subcutaneous Daily  . metoprolol tartrate  12.5 mg Per Tube BID  . multivitamin  15 mL Per Tube Daily  . oxybutynin  5 mg Per Tube BID  . pantoprazole (PROTONIX) IV  40 mg Intravenous Q12H  . polyethylene glycol  17 g Per Tube Daily  . sodium chloride flush  3 mL Intravenous Q12H  . traZODone  50 mg Per Tube QHS   Infusions:  . feeding supplement (JEVITY 1.2 CAL) 1,000 mL (09/16/16 1033)  . heparin     PRN: morphine injection, ondansetron (ZOFRAN) IV  Assessment: [age] yoF presented to ED on 1/25 with hematemesis.  PMH significant for PEG tube d/t dysphagia and recent LLE DVT started on Xarelto 1/13.   On admission, Xarelto was held, but per GI now OK to resume anticoagulation when medically important.  Dr. Waymon AmatoHongalgi has consulted pharmacy to dose Heparin IV x 24 hours then resume Xarelto if she remains stable and has no further s/s bleeding.  PTA Xarelto 15 mg via tube BID (started on 09/02/16, transition to 20mg  once daily on 09/23/16).  Last dose  1/24 at 1800.  Today, 09/16/2016: Heparin level supratherapeutic on 950 units/hr H/H stable Pltc WNL No bleeding reported   Goal of Therapy:  Heparin level 0.3-0.7 units/ml aPTT 66-102 seconds Monitor platelets by anticoagulation protocol: Yes   Plan:   Decrease heparin to 600 units/hr  Heparin level and APTT at 9:30 pm  Monitor for reports of bleeding  Await further word on potential for transition back to Xarelto.  Elie Goody, PharmD, BCPS Pager: [phone] 09/16/2016  1:16 PM

## 2016-09-16 NOTE — Progress Notes (Signed)
ANTICOAGULATION CONSULT NOTE -    Pharmacy Consult for Heparin Indication: DVT  Allergies  Allergen Reactions  . Doxepin Other (See Comments)    Unknown reaction  . Halcion [Triazolam] Other (See Comments)    unknown reaction  . Pletal [Cilostazol] Other (See Comments)    Unknown reaction    Patient Measurements: Height: 5\' 4"  (162.6 cm) Weight: 142 lb 12.8 oz (64.8 kg) IBW/kg (Calculated) : 54.7  Heparin dosing weight:  Total body weight  Vital Signs: Temp: 98.3 F (36.8 C) (01/26 2020) Temp Source: Oral (01/26 2020) BP: 155/67 (01/26 2020) Pulse Rate: 92 (01/26 2020)  Labs:  Recent Labs  09/14/16 0814 09/14/16 2008 09/15/16 0806 09/15/16 0831 09/16/16 0229  HGB 13.0 11.1*  --  10.9* 10.8*  HCT 39.4 34.6*  --  34.8* 33.9*  PLT 308 271  --  248 238  HEPARINUNFRC  --   --   --   --  0.77*  CREATININE 0.66  --  0.56  --   --     Estimated Creatinine Clearance: 46 mL/min (by C-G formula based on SCr of 0.56 mg/dL).   Medical History: Past Medical History:  Diagnosis Date  . Atherosclerotic heart disease   . Closed left tibial fracture    History of  . COPD (chronic obstructive pulmonary disease) (HCC)   . Dementia   . Depression   . Diabetes mellitus without complication (HCC)   . Fracture of right wrist    History of  . Frequent urinary incontinence   . Hyperlipidemia   . Hypertension   . Peripheral vascular disease (HCC)   . Pneumothorax, left    History of  . Right supracondylar humerus fracture    History of  . Unsp fracture of first lumbar vertebra, init for clos fx (HCC)     Medications:  Scheduled:  . atorvastatin  40 mg Per Tube Daily  . calcium-vitamin D  2 tablet Per Tube Daily  . cefTRIAXone (ROCEPHIN)  IV  1 g Intravenous Q24H  . insulin aspart  0-9 Units Subcutaneous Q4H  . insulin glargine  10 Units Subcutaneous Daily  . metoprolol  5 mg Intravenous Q12H  . pantoprazole (PROTONIX) IV  40 mg Intravenous Q12H  . polyethylene  glycol  17 g Per Tube Daily  . sodium chloride flush  3 mL Intravenous Q12H   Infusions:  . sodium chloride 50 mL/hr at 09/15/16 1345  . heparin     PRN: morphine injection, ondansetron (ZOFRAN) IV  Assessment: [age] yoF presented to ED on 1/25 with hematemesis.  PMH significant for PEG tube d/t dysphagia and recent LLE DVT started on Xarelto 1/13.   On admission, Xarelto was held, but per GI now OK to resume anticoagulation when medically important.  Dr. Waymon AmatoHongalgi has consulted pharmacy to dose Heparin IV x 24 hours then resume Xarelto if she remains stable and has no further s/s bleeding.  PTA Xarelto 15 mg via tube BID (started on 09/02/16, transition to 20mg  once daily on 09/23/16).  Last dose 1/24 at 1800.  Today, 09/16/2016: Heparin level = 0.77 (supratherapeutic) CBC:  Stable Patient with GI bleed  Goal of Therapy:  Heparin level 0.3-0.7 units/ml aPTT 66-102 seconds Monitor platelets by anticoagulation protocol: Yes   Plan:   Decrease heparin IV infusion to 950 units/hr  Heparin level 8 hours after decreasing rate  Daily heparin level and CBC  Continue to monitor H&H and platelets   Terrilee FilesLeann Merranda Bolls, PharmD 09/16/2016 3:36 AM
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
## 2016-09-16 NOTE — Progress Notes (Signed)
ANTICOAGULATION CONSULT NOTE   Pharmacy Consult for Xarelto Indication: LLE DVT  Allergies  Allergen Reactions  . Doxepin Other (See Comments)    Unknown reaction  . Halcion [Triazolam] Other (See Comments)    unknown reaction  . Pletal [Cilostazol] Other (See Comments)    Unknown reaction    Patient Measurements: Height: 5\' 4"  (162.6 cm) Weight: 143 lb (64.9 kg) IBW/kg (Calculated) : 54.7  Heparin dosing weight:  Total body weight  Vital Signs: Temp: 99.7 F (37.6 C) (01/27 1332) Temp Source: Oral (01/27 1332) BP: 131/50 (01/27 1332) Pulse Rate: 82 (01/27 1332)  Labs:  Recent Labs  09/14/16 0814 09/14/16 2008 09/15/16 0806 09/15/16 0831 09/16/16 0229 09/16/16 1146  HGB 13.0 11.1*  --  10.9* 10.8*  --   HCT 39.4 34.6*  --  34.8* 33.9*  --   PLT 308 271  --  248 238  --   APTT  --   --   --   --  135*  --   HEPARINUNFRC  --   --   --   --  0.77* 1.18*  CREATININE 0.66  --  0.56  --   --   --     Estimated Creatinine Clearance: 46 mL/min (by C-G formula based on SCr of 0.56 mg/dL).  Medications:  Scheduled:  . amoxicillin  500 mg Per Tube Q8H  . atorvastatin  40 mg Per Tube Daily  . calcium-vitamin D  2 tablet Per Tube Daily  . donepezil  5 mg Per Tube QHS  . fentaNYL  12.5 mcg Transdermal Q72H  . insulin aspart  0-9 Units Subcutaneous Q4H  . insulin glargine  10 Units Subcutaneous Daily  . metoprolol tartrate  12.5 mg Per Tube BID  . multivitamin  15 mL Per Tube Daily  . oxybutynin  5 mg Per Tube BID  . pantoprazole (PROTONIX) IV  40 mg Intravenous Q12H  . polyethylene glycol  17 g Per Tube Daily  . sodium chloride flush  3 mL Intravenous Q12H  . traZODone  50 mg Per Tube QHS   Infusions:  . feeding supplement (JEVITY 1.2 CAL) 1,000 mL (09/16/16 1730)  . heparin 600 Units/hr (09/16/16 1332)   PRN: morphine injection, ondansetron (ZOFRAN) IV  Assessment: 89 yoF presented to ED on 1/25 with hematemesis.  PMH significant for PEG tube d/t dysphagia  and recent LLE DVT started on Xarelto 1/13.   On admission, Xarelto was held, but per GI now OK to resume anticoagulation when medically important.  Dr. Waymon AmatoHongalgi has consulted pharmacy to dose Heparin IV x 24 hours then resume Xarelto if she remains stable and has no further s/s bleeding.  PTA Xarelto 15 mg via tube BID (started on 09/02/16, transition to 20mg  once daily on 09/23/16).  Last dose 1/24 at 1800.  Today, 09/16/2016:  Heparin level previously supratherapeutic and infusion rate decreased  H/H stable, Pltc WNL  No s/s bleeding reported.  At 17:40 RN reports that during TF residual check there were no signs of rebleeding.  MD has consulted pharmacy to resume Xarelto dosing.   Goal of Therapy:  Heparin level 0.3-0.7 units/ml aPTT 66-102 seconds Monitor platelets by anticoagulation protocol: Yes   Plan:   Discontinue heparin at 1800  At 1800, resume Xarelto 15 mg via tube BID  Continue with plan for transition to 20mg  once daily on 09/23/16 per outpatient records.  Monitor for reports of bleeding  Lynann BeaverChristine Anayia Eugene PharmD, BCPS Pager [phone] 09/16/2016 5:43 PM

## 2016-09-17 ENCOUNTER — Encounter (HOSPITAL_COMMUNITY): Payer: Self-pay

## 2016-09-17 LAB — GLUCOSE, CAPILLARY
GLUCOSE-CAPILLARY: 230 mg/dL — AB (ref 65–99)
GLUCOSE-CAPILLARY: 235 mg/dL — AB (ref 65–99)
GLUCOSE-CAPILLARY: 172 mg/dL — AB (ref 65–99)
Glucose-Capillary: 182 mg/dL — ABNORMAL HIGH (ref 65–99)
Glucose-Capillary: 218 mg/dL — ABNORMAL HIGH (ref 65–99)
Glucose-Capillary: 294 mg/dL — ABNORMAL HIGH (ref 65–99)

## 2016-09-17 LAB — CBC
HEMATOCRIT: 32.9 % — AB (ref 36.0–46.0)
Hemoglobin: 10.7 g/dL — ABNORMAL LOW (ref 12.0–15.0)
MCH: 30.8 pg (ref 26.0–34.0)
MCHC: 32.5 g/dL (ref 30.0–36.0)
MCV: 94.8 fL (ref 78.0–100.0)
Platelets: 236 10*3/uL (ref 150–400)
RBC: 3.47 MIL/uL — AB (ref 3.87–5.11)
RDW: 15.2 % (ref 11.5–15.5)
WBC: 5.5 10*3/uL (ref 4.0–10.5)

## 2016-09-17 MED ORDER — INSULIN GLARGINE 100 UNIT/ML ~~LOC~~ SOLN
10.0000 [IU] | Freq: Two times a day (BID) | SUBCUTANEOUS | Status: DC
  Administered 2016-09-17 – 2016-09-19 (×4): 10 [IU] via SUBCUTANEOUS
  Filled 2016-09-17 (×6): qty 0.1

## 2016-09-17 MED ORDER — ACETAMINOPHEN 160 MG/5ML PO SOLN
650.0000 mg | Freq: Once | ORAL | Status: AC
  Administered 2016-09-17: 650 mg via ORAL
  Filled 2016-09-17: qty 20.3
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
## 2016-09-17 NOTE — Clinical Social Work Note (Signed)
CSW spoke with pt's daughter, however only bed offer was [facility], and they were hoping for a facility closer to home. Pt has used 55 days of her SNF days. CSW reached out to [facility] about possible admit. Per voicemail, they are unable to accept pt, as it is pending review by DON on Monday. Weekday CSW will continue to follow.   Diana QuerySarah Alanea Hill, MSW, LCSW Clinical Social Worker  [phone]

## 2016-09-17 NOTE — Progress Notes (Signed)
Nutrition Brief Note  RD consulted for TF initiation and management.  Per RN, pt is discharging to SNF today. SLP evaluated patient and recommended purees and thin liquids at SNF after discharge.   Currently patient is receiving Jevity 1.2 @ 50 ml/hr, providing 1440 kcal and 66g protein.  Per chart review, pt normally uses Jevity 1.5 @ 100 ml/hr over 12 hours which provides 1800 kcal and 76g protein. Would recommend patient resume night feed regimen at SNF.  Wt Readings from Last 15 Encounters:  09/16/16 143 lb (64.9 kg)    Body mass index is 24.55 kg/m. Patient meets criteria for normal based on current BMI.   Labs and medications reviewed.   Tilda FrancoLindsey Carlyon Nolasco, MS, RD, LDN Pager: [phone] After Hours Pager: [phone]

## 2016-09-18 LAB — URINE CULTURE: Culture: >=100000 — AB

## 2016-09-18 LAB — GLUCOSE, CAPILLARY
GLUCOSE-CAPILLARY: 185 mg/dL — AB (ref 65–99)
GLUCOSE-CAPILLARY: 210 mg/dL — AB (ref 65–99)
GLUCOSE-CAPILLARY: 265 mg/dL — AB (ref 65–99)
GLUCOSE-CAPILLARY: 266 mg/dL — AB (ref 65–99)
Glucose-Capillary: 173 mg/dL — ABNORMAL HIGH (ref 65–99)
Glucose-Capillary: 244 mg/dL — ABNORMAL HIGH (ref 65–99)

## 2016-09-18 MED ORDER — INSULIN ASPART 100 UNIT/ML ~~LOC~~ SOLN
0.0000 [IU] | SUBCUTANEOUS | Status: DC
  Administered 2016-09-18: 8 [IU] via SUBCUTANEOUS
  Administered 2016-09-18: 5 [IU] via SUBCUTANEOUS
  Administered 2016-09-19: 2 [IU] via SUBCUTANEOUS
  Administered 2016-09-19: 5 [IU] via SUBCUTANEOUS
  Administered 2016-09-19: 3 [IU] via SUBCUTANEOUS
  Administered 2016-09-19: 5 [IU] via SUBCUTANEOUS

## 2016-09-18 MED ORDER — ACETAMINOPHEN 160 MG/5ML PO SOLN: 650.0000 mg | Freq: Four times a day (QID) | ORAL | Status: DC | PRN

## 2016-09-18 NOTE — Progress Notes (Signed)
Inpatient Diabetes Program Recommendations  AACE/ADA: New Consensus Statement on Inpatient Glycemic Control (2015)  Target Ranges:  Prepandial:   less than 140 mg/dL      Peak postprandial:   less than 180 mg/dL (1-2 hours)      Critically ill patients:  140 - 180 mg/dL   Lab Results  Component Value Date   GLUCAP 244 (H) 09/18/2016   HGBA1C 6.2 (H) 06/22/2016    Review of Glycemic Control  Hyperglycemia. Needs insulin adjustment.  Inpatient Diabetes Program Recommendations:   Increase Novolog to 0-15 units Q4H.  Will continue to follow.  Thank you. Ailene Ardshonda Haidy Kackley, RD, LDN, CDE Inpatient Diabetes Coordinator [phone]
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
## 2016-09-18 NOTE — Care Management Important Message (Signed)
Important Message  Patient Details  Name: Diana Hill MRN: [id number] Date of Birth: 10-26-1930   Medicare Important Message Given:  Yes    Caren MacadamFuller, Juliannah Ohmann 09/18/2016, 4:30 PMImportant Message  Patient Details  Name: Diana Hill MRN: [id number] Date of Birth: 10-26-1930   Medicare Important Message Given:  Yes    Caren MacadamFuller, Lary Eckardt 09/18/2016, 4:30 PM

## 2016-09-18 NOTE — Care Management Note (Signed)
Case Management Note  Patient Details  Name: Diana Hill MRN: [id number] Date of Birth: August 14, 1931  Subjective/Objective:                    Action/Plan:d/c SNF.   Expected Discharge Date:   (unknown)               Expected Discharge Plan:  Skilled Nursing Facility  In-House Referral:  Clinical Social Work  Discharge planning Services  CM Consult  Post Acute Care Choice:    Choice offered to:     DME Arranged:    DME Agency:     HH Arranged:    HH Agency:     Status of Service:  Completed, signed off  If discussed at [employer] Meetings, dates discussed:    Additional Comments:  Lanier ClamMahabir, Thelda Gagan, RN 09/18/2016, 12:16 PM
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
## 2016-09-19 LAB — CBC
HEMATOCRIT: 33.1 % — AB (ref 36.0–46.0)
HEMOGLOBIN: 10.6 g/dL — AB (ref 12.0–15.0)
MCH: 29.7 pg (ref 26.0–34.0)
MCHC: 32 g/dL (ref 30.0–36.0)
MCV: 92.7 fL (ref 78.0–100.0)
Platelets: 206 10*3/uL (ref 150–400)
RBC: 3.57 MIL/uL — AB (ref 3.87–5.11)
RDW: 14.8 % (ref 11.5–15.5)
WBC: 6.3 10*3/uL (ref 4.0–10.5)

## 2016-09-19 LAB — GLUCOSE, CAPILLARY
GLUCOSE-CAPILLARY: 212 mg/dL — AB (ref 65–99)
Glucose-Capillary: 144 mg/dL — ABNORMAL HIGH (ref 65–99)
Glucose-Capillary: 192 mg/dL — ABNORMAL HIGH (ref 65–99)
Glucose-Capillary: 242 mg/dL — ABNORMAL HIGH (ref 65–99)

## 2016-09-19 MED ORDER — FENTANYL 12 MCG/HR TD PT72: 12.5000 ug | MEDICATED_PATCH | TRANSDERMAL | 0 refills | Status: AC

## 2016-09-19 MED ORDER — JEVITY 1.2 CAL PO LIQD: 1000.0000 mL | ORAL | Status: AC

## 2016-09-19 MED ORDER — RIVAROXABAN 20 MG PO TABS: 20.0000 mg | ORAL_TABLET | Freq: Every day | ORAL | Status: AC

## 2016-09-19 MED ORDER — PANTOPRAZOLE SODIUM 40 MG PO PACK: 40.0000 mg | PACK | Freq: Two times a day (BID) | ORAL | Status: AC

## 2016-09-19 MED ORDER — AMOXICILLIN 250 MG/5ML PO SUSR: 500.0000 mg | Freq: Three times a day (TID) | ORAL | Status: AC

## 2016-09-19 MED ORDER — RIVAROXABAN 15 MG PO TABS: 15.0000 mg | ORAL_TABLET | Freq: Two times a day (BID) | ORAL | Status: AC

## 2016-09-19 MED ORDER — MORPHINE SULFATE (CONCENTRATE) 10 MG /0.5 ML PO SOLN: 5.0000 mg | Freq: Four times a day (QID) | ORAL | 0 refills | Status: AC | PRN

## 2016-09-19 NOTE — Progress Notes (Signed)
Report called to [facility], spoke with ______RN.  All questions answered.  PT will be wheeled out by PTAR.

## 2016-09-19 NOTE — Clinical Social Work Placement (Signed)
Patient is set to discharge to [facility] today. Patient & daughter, Sherry RuffingCherri made aware. Discharge packet given to RN, Florentina AddisonKatie. PT[facility] called for transport.     Lincoln MaxinKelly Dequincy Born, LCSW [facility] Clinical Social Worker cell #: [phone]    CLINICAL SOCIAL WORK PLACEMENT  NOTE  Date:  09/19/2016  Patient Details  Name: Diana LookMarie C Prigmore MRN: [id number] Date of Birth: Dec 03, 1930  Clinical Social Work is seeking post-discharge placement for this patient at the Skilled  Nursing Facility level of care (*CSW will initial, date and re-position this form in  chart as items are completed):  Yes   Patient/family provided with [facility] Clinical Social Work Department's list of facilities offering this level of care within the geographic area requested by the patient (or if unable, by the patient's family).  Yes   Patient/family informed of their freedom to choose among providers that offer the needed level of care, that participate in Medicare, Medicaid or managed care program needed by the patient, have an available bed and are willing to accept the patient.  Yes   Patient/family informed of 's ownership interest in [facility] and [facility], as well as of the fact that they are under no obligation to receive care at these facilities.  PASRR submitted to EDS on       PASRR number received on       Existing PASRR number confirmed on 09/15/16     FL2 transmitted to all facilities in geographic area requested by pt/family on 09/15/16     FL2 transmitted to all facilities within larger geographic area on       Patient informed that his/her managed care company has contracts with or will negotiate with certain facilities, including the following:        Yes   Patient/family informed of bed offers received.  [facility]     Physician recommends and patient chooses bed at      [facility].  Patient to be transferred to facility by PTAR     Patient family notified on 09/19/16 of transfer.  Name of family member notified:  patient's daughter, Cherri via phone     PHYSICIAN       Additional Comment:    _______________________________________________ Arlyss RepressHarrison, Kristyne Woodring F, LCSW 09/19/2016, 2:27 PM
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
## 2016-09-19 NOTE — Progress Notes (Addendum)
Report called to [employer] Financial[employer]ittni, Charity fundraiserN at [facility].  Rn made aware that foley was taken out at 12pm and is due to void.  PT will be discharged via [facility].  VSS.  Pt voided at 1519.   RN at SNF made aware.

## 2016-09-19 NOTE — Discharge Instructions (Signed)
Gastrointestinal Bleeding °Gastrointestinal (GI) bleeding is bleeding somewhere along the digestive tract, between the mouth and anus. This can be caused by various problems. The severity of these problems can range from mild to serious or even life-threatening. If you have GI bleeding, you may find blood in your stools (feces), you may have black stools, or you may vomit blood. If there is a lot of bleeding, you may need to stay in the hospital. °What are the causes? °This condition may be caused by: °· Esophagitis. This is inflammation, irritation, or swelling of the esophagus. °· Hemorrhoids. These are swollen veins in the rectum. °· Anal fissures. These are areas of painful tearing that are often caused by passing hard stool. °· Diverticulosis. These are pouches that form on the colon over time, with age, and may bleed a lot. °· Diverticulitis. This is inflammation in areas with diverticulosis. It can cause pain, fever, and bloody stools, although bleeding may be mild. °· Polyps and cancer. Colon cancer often starts out as precancerous polyps. °· Gastritis and ulcers. With these, bleeding may come from the upper GI tract, near the stomach. °What are the signs or symptoms? °Symptoms of this condition may include: °· Bright red blood in your vomit, or vomit that looks like coffee grounds. °· Bloody, black, or tarry stools. °¨ Bleeding from the lower GI tract will usually cause red or maroon blood in the stools. °¨ Bleeding from the upper GI tract may cause black, tarry, often bad-smelling stools. °¨ In certain cases, if the bleeding is fast enough, the stools may be red. °· Pain or cramping in the abdomen. °How is this diagnosed? °This condition may be diagnosed based on: °· Medical history and physical exam. °· Various tests, such as: °¨ Blood tests. °¨ X-rays and other imaging tests. °¨ Esophagogastroduodenoscopy (EGD). In this test, a flexible, lighted tube is used to look at your esophagus, stomach, and small  intestine. °¨ Colonoscopy. In this test, a flexible, lighted tube is used to look at your colon. °How is this treated? °Treatment for this condition depends on the cause of the bleeding. For example: °· For bleeding from the esophagus, stomach, small intestine, or colon, the health care provider doing your EGD or colonoscopy may be able to stop the bleeding as part of the procedure. °· Inflammation or infection of the colon can be treated with medicines. °· Certain rectal problems can be treated with creams, suppositories, or warm baths. °· Surgery is sometimes needed. °· Blood transfusions are sometimes needed if a lot of blood has been lost. °If bleeding is slow, you may be allowed to go home. If there is a lot of bleeding, you will need to stay in the hospital for observation. °Follow these instructions at home: °· Take over-the-counter and prescription medicines only as told by your health care provider. °· Eat foods that are high in fiber. This will help to keep your stools soft. These foods include whole grains, legumes, fruits, and vegetables. Eating 1-3 prunes each day works well for many people. °· Drink enough fluid to keep your urine clear or pale yellow. °· Keep all follow-up visits as told by your health care provider. This is important. °Contact a health care provider if: °· Your symptoms do not improve. °Get help right away if: °· Your bleeding increases. °· You feel light-headed or you faint. °· You feel weak. °· You have severe cramps in your back or abdomen. °· You pass large blood clots in your stool. °· Your symptoms are   getting worse. °This information is not intended to replace advice given to you by your health care provider. Make sure you discuss any questions you have with your health care provider. °Document Released: 08/04/2000 Document Revised: 01/05/2016 Document Reviewed: 01/25/2015 °Elsevier Interactive Patient Education © 2017 [employer] ° °

## 2018-05-22 IMAGING — CR DG CHEST 1V PORT
1 series · 1 of 1 positions shown · non-contrast
Comparison: 04/08/2016

CLINICAL DATA: Respiratory failure

EXAM:
PORTABLE CHEST 1 VIEW

[AP]
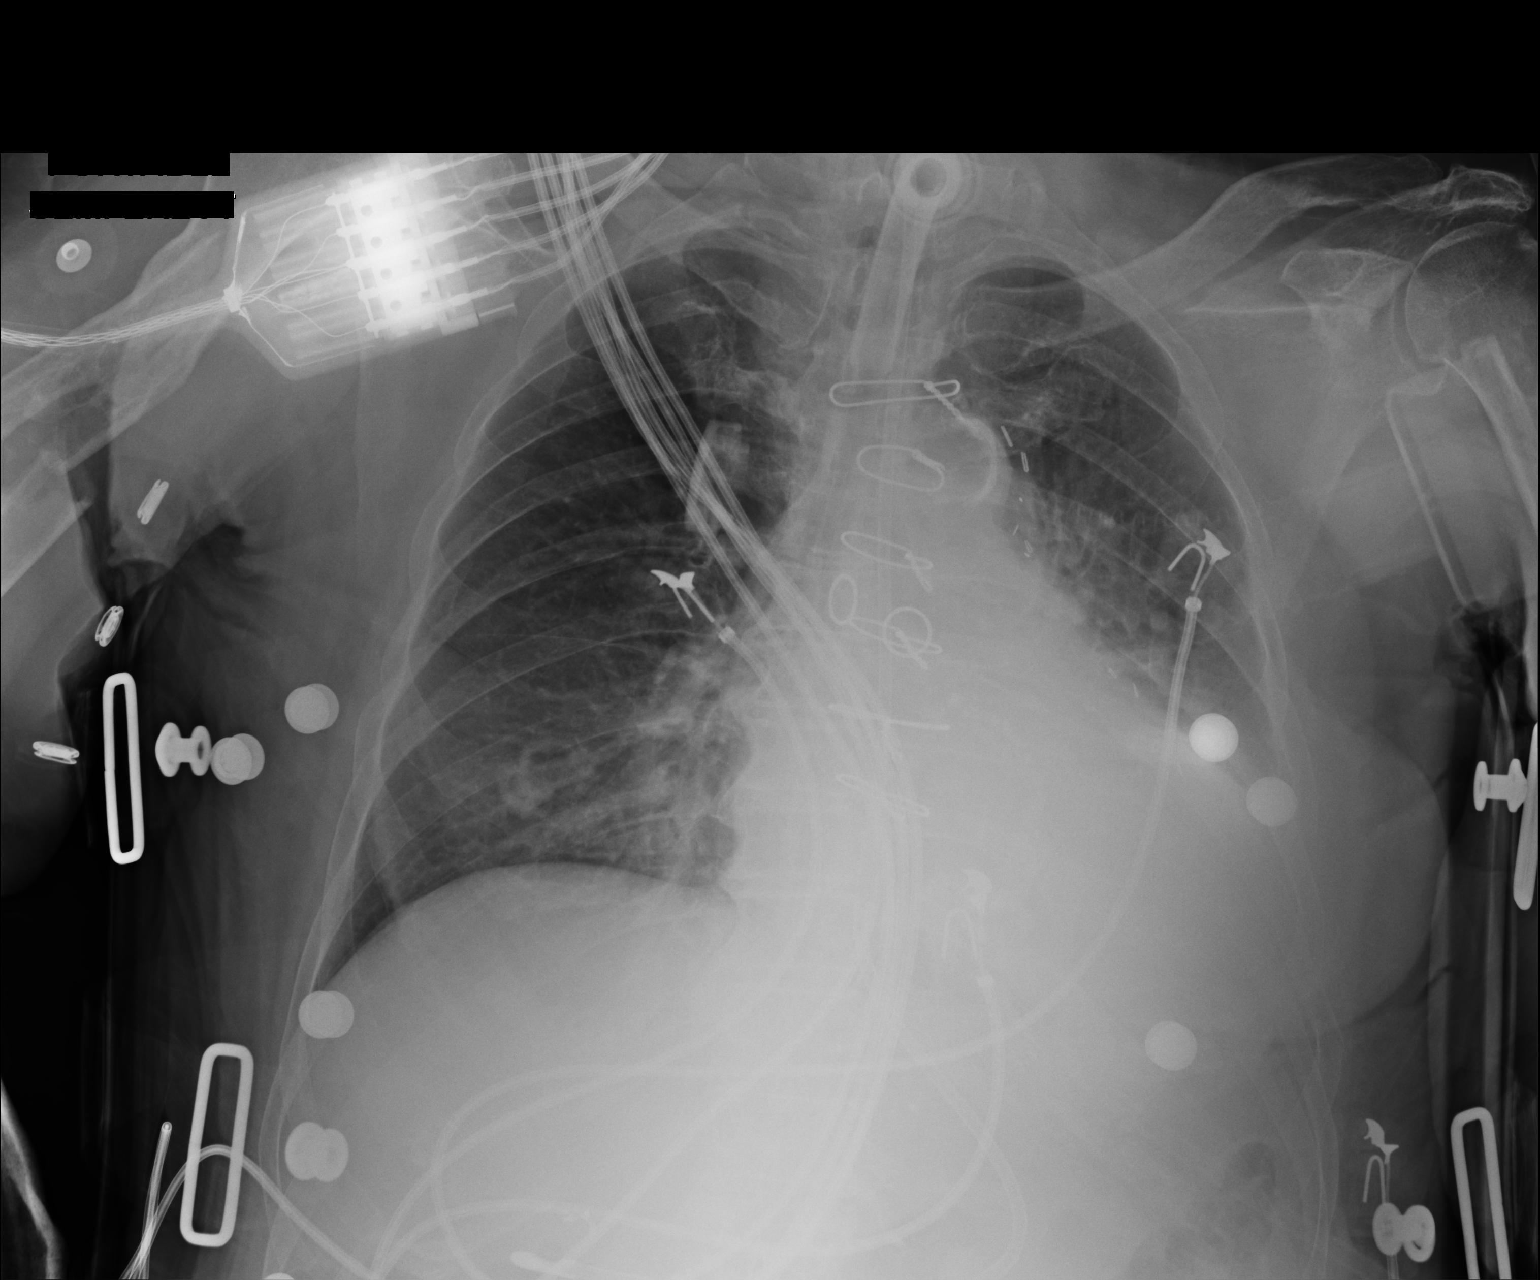

[1 of 1 positions shown; findings below may reference images not displayed]

FINDINGS: Semi-erect portable view chest. A tracheostomy tube has been placed,
the tip is approximately 4.5 cm superior to the carina.

There are median sternotomy wires and surgical changes. There is
mild cardiomegaly. There is mild right infrahilar atelectasis or
infiltrate. There is dense left lower lobe consolidation and
possible small effusion. Atherosclerosis.

Esophageal tube tip overlies the distal stomach.

There is deformity of the proximal humerus consistent with old
fracture, there is possible acute fracture deformity of the shaft
distal to the old deformity.
IMPRESSION: 1. Dense left lower lobe consolidation with probable small effusion.
Mild right infrahilar atelectasis or infiltrate
2. Mild enlargement of the cardiomediastinal with atherosclerosis
3. Old fracture deformity of the proximal right humerus. Possible
acute fracture of the shaft of the humerus, incompletely visualized.
Dedicated right humerus x-rays may be obtained.

## 2018-05-28 IMAGING — DX DG HUMERUS 2V *R*
2 series · 2 of 2 positions shown · non-contrast
Comparison: November 13, 2014

CLINICAL DATA: Humerus fracture

EXAM:
RIGHT HUMERUS - 2+ VIEW

[humerus ap]
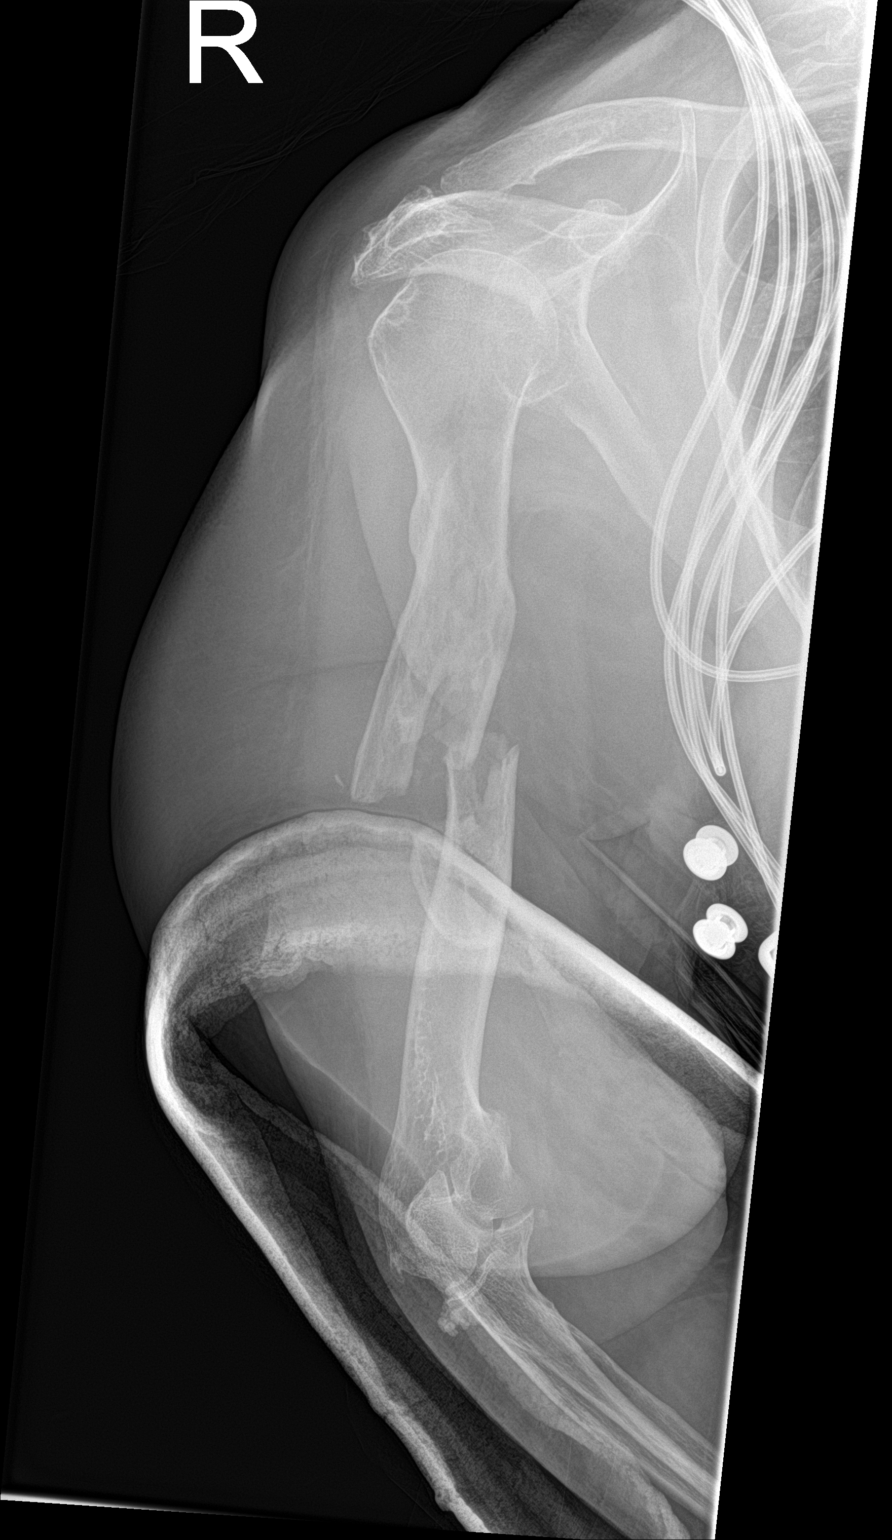

[humerus lat]
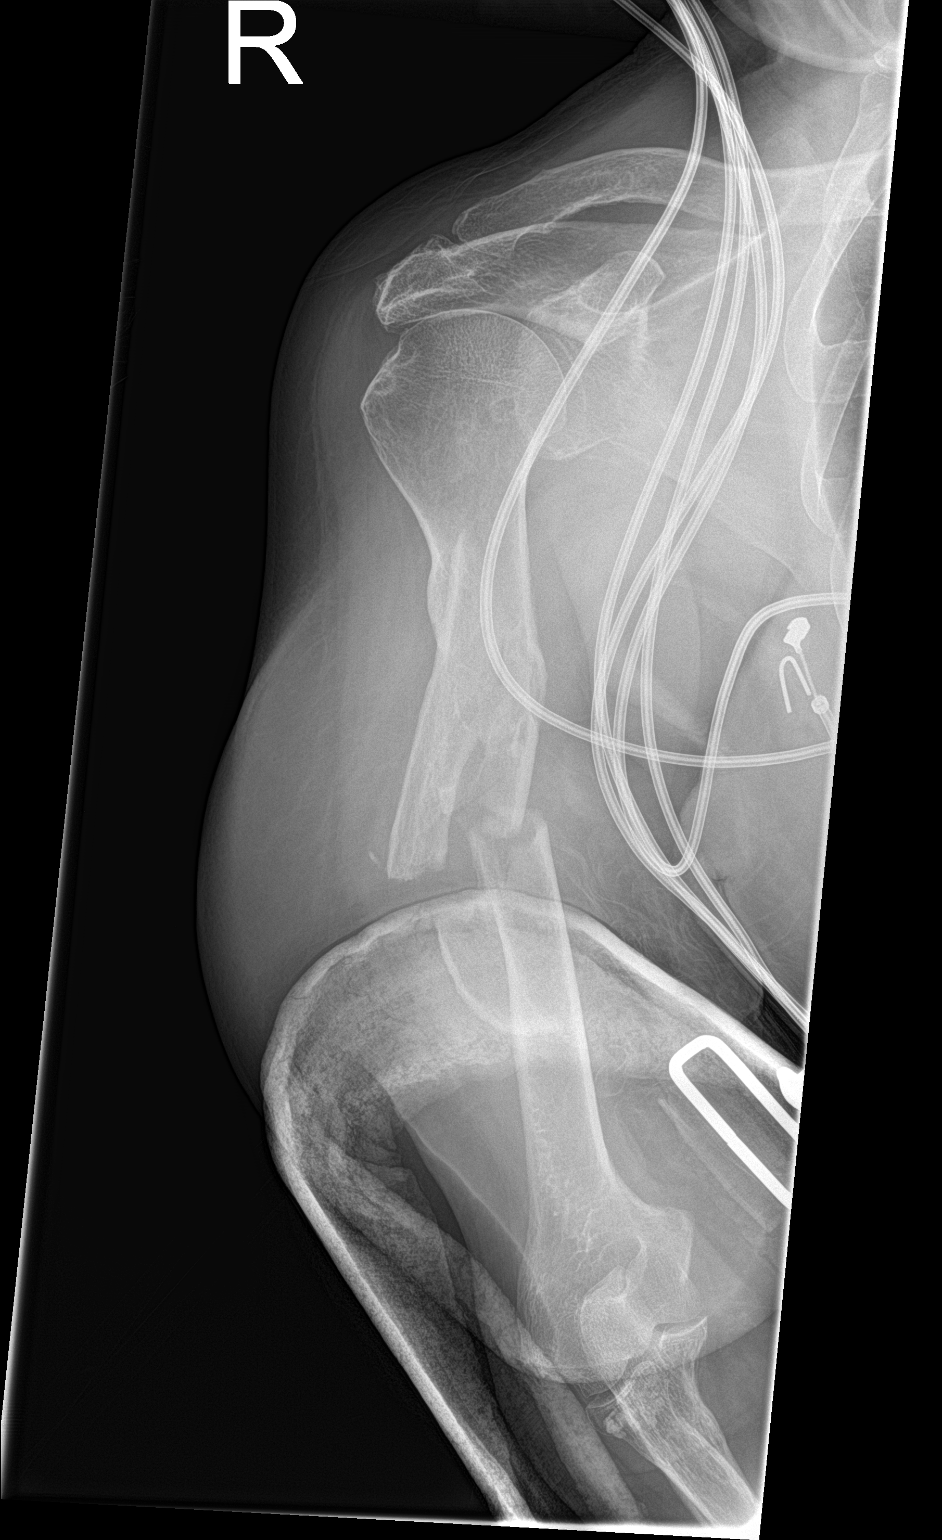

[2 of 2 positions shown; findings below may reference images not displayed]

FINDINGS: Frontal and attempted lateral view obtained. There is a comminuted
fracture of the mid right humerus with displaced fracture fragments.
Specifically, there is medial displacement of the distal major
fracture fragment with respect proximal fragment. Slightly superior
to this acute fracture, there is remodeling from an older fracture.
No dislocation. There is moderate osteoarthritic change in the elbow
and shoulder joint regions.
IMPRESSION: Displaced acute appearing fracture mid humerus slightly inferior to
a prior fracture of the humerus with remodeling in the area of prior
fracture slightly superior to the current acute fracture. No
dislocation. Osteoarthritic change in the shoulder and elbow joints
noted.

## 2018-05-28 IMAGING — DX DG TIBIA/FIBULA PORT 2V*L*
3 series · 3 of 3 positions shown · non-contrast
Comparison: No recent prior.

CLINICAL DATA: Follow-up fracture.

EXAM:
PORTABLE LEFT TIBIA AND FIBULA - 2 VIEW

[tibia ap]
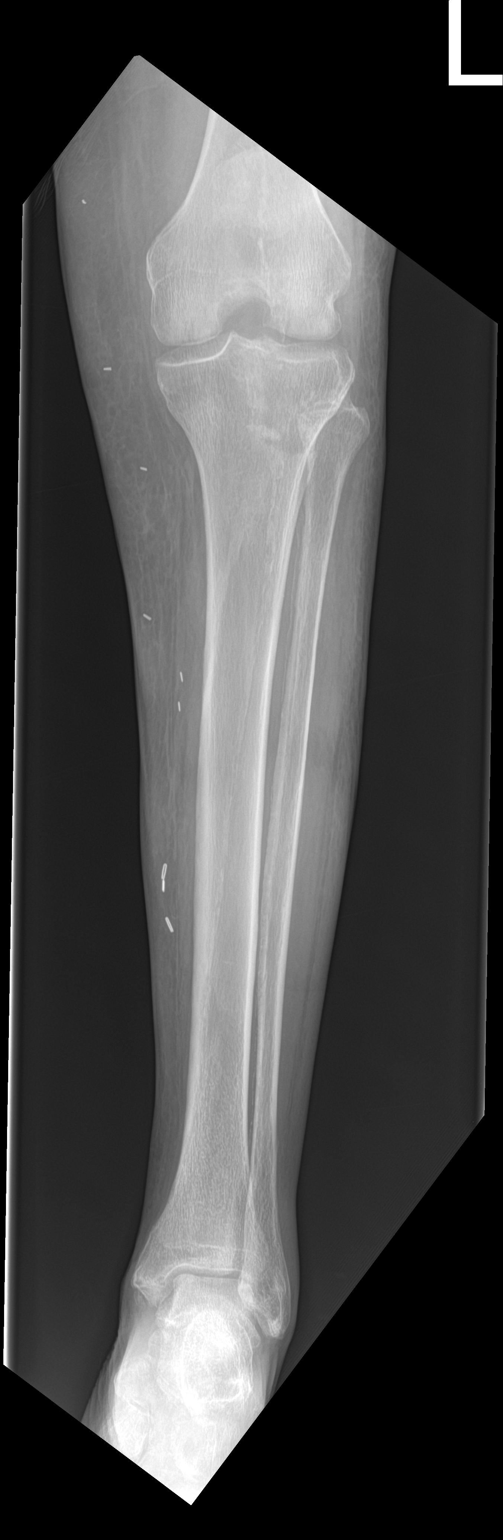

[tibia lat (1 of 2)]
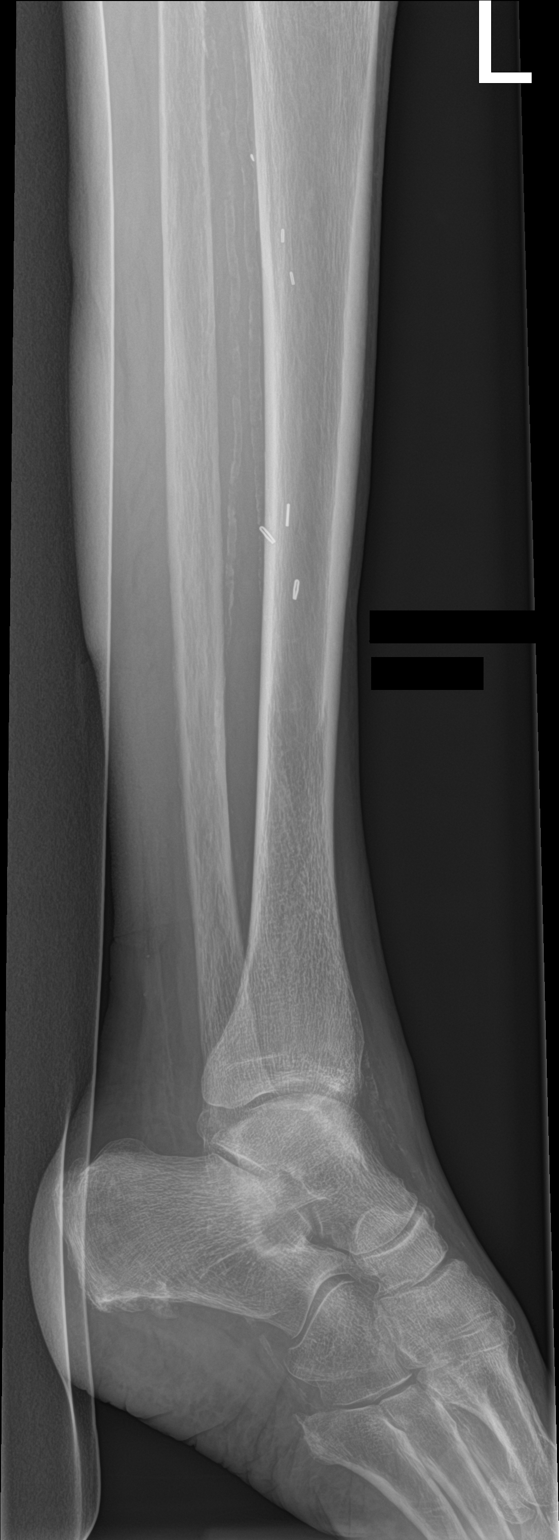

[tibia lat (2 of 2)]
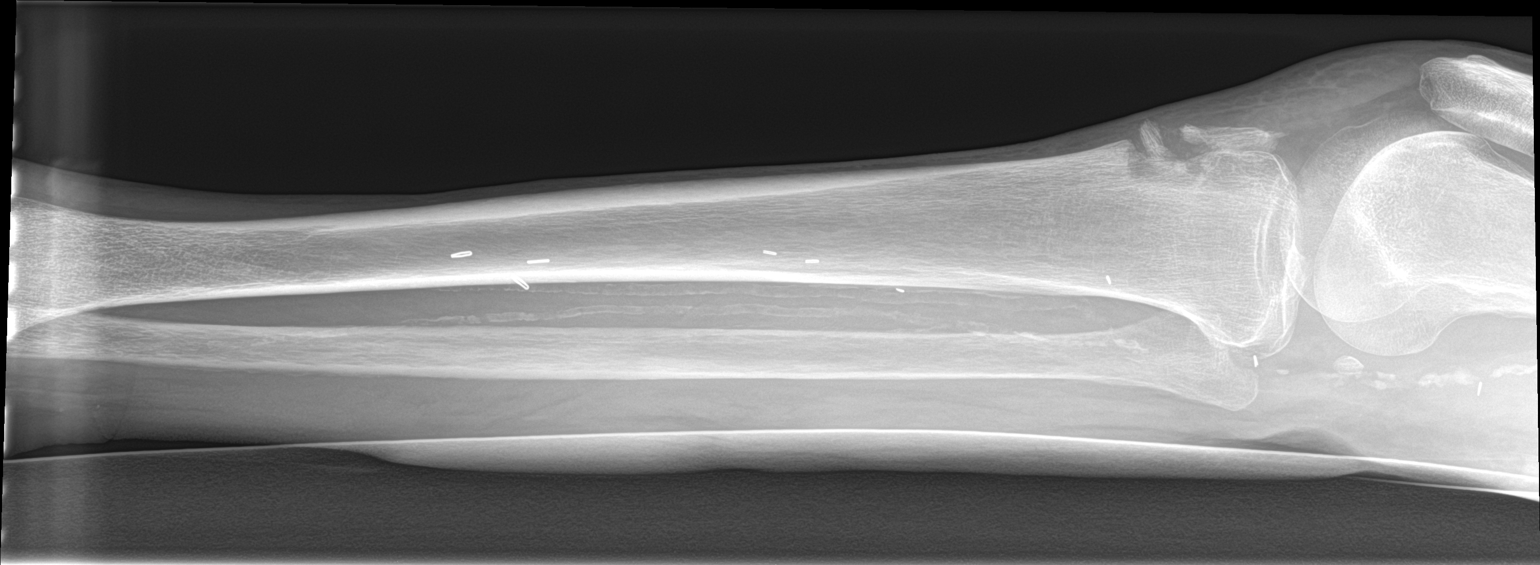

[3 of 3 positions shown; findings below may reference images not displayed]

FINDINGS: Surgical clips are noted over the left lower extremity.
Fragmentation of the tibial tuberosity is noted, age undetermined.
No other focal bony abnormality identified. Peripheral vascular
calcification.
IMPRESSION: 1. Fragmentation of the tibial tuberosity, age undetermined. No
other focal bony abnormality identified.

2. Peripheral vascular disease.

## 2020-05-21 DEATH — deceased
